# Patient Record
Sex: Female | Born: 1945 | Race: White | Hispanic: No | Smoking: Current every day smoker
Health system: Southern US, Community
[De-identification: ages and names within clinical notes are randomized; demographics above are authoritative.]

## PROBLEM LIST (undated history)

## (undated) DIAGNOSIS — M6530 Trigger finger, unspecified finger: Secondary | ICD-10-CM

## (undated) DIAGNOSIS — Z8711 Personal history of peptic ulcer disease: Secondary | ICD-10-CM

## (undated) DIAGNOSIS — E039 Hypothyroidism, unspecified: Secondary | ICD-10-CM

## (undated) DIAGNOSIS — R002 Palpitations: Secondary | ICD-10-CM

## (undated) DIAGNOSIS — Z720 Tobacco use: Secondary | ICD-10-CM

## (undated) DIAGNOSIS — I471 Supraventricular tachycardia, unspecified: Secondary | ICD-10-CM

## (undated) DIAGNOSIS — M1990 Unspecified osteoarthritis, unspecified site: Secondary | ICD-10-CM

## (undated) DIAGNOSIS — E785 Hyperlipidemia, unspecified: Secondary | ICD-10-CM

## (undated) DIAGNOSIS — M5030 Other cervical disc degeneration, unspecified cervical region: Secondary | ICD-10-CM

## (undated) HISTORY — PX: KNEE SURGERY: SHX244

## (undated) HISTORY — DX: Trigger finger, unspecified finger: M65.30

## (undated) HISTORY — DX: Supraventricular tachycardia: I47.1

## (undated) HISTORY — DX: Supraventricular tachycardia, unspecified: I47.10

## (undated) HISTORY — DX: Palpitations: R00.2

## (undated) HISTORY — DX: Tobacco use: Z72.0

## (undated) HISTORY — DX: Hyperlipidemia, unspecified: E78.5

## (undated) HISTORY — DX: Unspecified osteoarthritis, unspecified site: M19.90

## (undated) HISTORY — DX: Personal history of peptic ulcer disease: Z87.11

## (undated) HISTORY — DX: Other cervical disc degeneration, unspecified cervical region: M50.30

## (undated) HISTORY — DX: Hypothyroidism, unspecified: E03.9

---

## 2006-01-07 ENCOUNTER — Ambulatory Visit: Payer: Self-pay | Admitting: Family Medicine

## 2007-02-11 ENCOUNTER — Ambulatory Visit: Payer: Self-pay | Admitting: Family Medicine

## 2008-04-14 ENCOUNTER — Ambulatory Visit: Payer: Self-pay | Admitting: Family Medicine

## 2009-06-23 ENCOUNTER — Ambulatory Visit: Payer: Self-pay | Admitting: Internal Medicine

## 2010-07-03 ENCOUNTER — Ambulatory Visit: Payer: Self-pay | Admitting: Internal Medicine

## 2010-12-28 ENCOUNTER — Emergency Department: Payer: Self-pay | Admitting: Internal Medicine

## 2011-07-12 ENCOUNTER — Encounter: Payer: Self-pay | Admitting: *Deleted

## 2011-07-15 ENCOUNTER — Ambulatory Visit: Payer: Self-pay | Admitting: Internal Medicine

## 2011-07-18 ENCOUNTER — Encounter (INDEPENDENT_AMBULATORY_CARE_PROVIDER_SITE_OTHER): Payer: Medicare Other | Admitting: *Deleted

## 2011-07-18 DIAGNOSIS — R002 Palpitations: Secondary | ICD-10-CM

## 2011-07-18 DIAGNOSIS — I498 Other specified cardiac arrhythmias: Secondary | ICD-10-CM

## 2011-07-23 ENCOUNTER — Encounter: Payer: Self-pay | Admitting: *Deleted

## 2011-07-24 ENCOUNTER — Ambulatory Visit (INDEPENDENT_AMBULATORY_CARE_PROVIDER_SITE_OTHER): Payer: Medicare Other | Admitting: Cardiovascular Disease

## 2011-07-24 ENCOUNTER — Encounter: Payer: Self-pay | Admitting: Cardiovascular Disease

## 2011-07-24 VITALS — BP 122/80 | HR 65 | Ht 61.0 in | Wt 159.0 lb

## 2011-07-24 DIAGNOSIS — E039 Hypothyroidism, unspecified: Secondary | ICD-10-CM

## 2011-07-24 DIAGNOSIS — I471 Supraventricular tachycardia: Secondary | ICD-10-CM

## 2011-07-24 DIAGNOSIS — R002 Palpitations: Secondary | ICD-10-CM

## 2011-07-24 DIAGNOSIS — F17200 Nicotine dependence, unspecified, uncomplicated: Secondary | ICD-10-CM

## 2011-07-24 DIAGNOSIS — I498 Other specified cardiac arrhythmias: Secondary | ICD-10-CM

## 2011-07-24 MED ORDER — PROPRANOLOL HCL 20 MG PO TABS: 20.0000 mg | ORAL_TABLET | Freq: Three times a day (TID) | ORAL | Status: DC | PRN

## 2011-07-24 NOTE — Assessment & Plan Note (Signed)
She seems to be feeling better on the lower dose of thyroid medication. She will followup with Dr. Arlana Pouch.

## 2011-07-24 NOTE — Patient Instructions (Addendum)
You are doing well. Take propranolol as needed for palpitations. Stop smoking!!  Please call us if you have new issues that need to be addressed

## 2011-07-24 NOTE — Assessment & Plan Note (Signed)
Long smoking history who continues to smoke one pack per day. We have encouraged her to continue to work on weaning her cigarettes and smoking cessation. She will continue to work on this and does not want any assistance with chantix.

## 2011-07-24 NOTE — Assessment & Plan Note (Signed)
Frequent ectopy, predominantly APCs noted. Rare short runs of SVT. She is currently asymptomatic after her thyroid medicine was decreased and she cut back on her caffeine. Stress may also have been playing a role in her symptoms. We have given her a prescription for propranolol 20 mg p.r.n. For palpitations. She could start with a half pill if needed.

## 2011-07-24 NOTE — Progress Notes (Signed)
   Patient ID: Kayler Buckholtz, female    DOB: Apr 20, 1946, 66 y.o.   MRN: [id number]  HPI Comments: Ms. Ballester is a very pleasant 66 year old woman, patient of Dr. Arlana Pouch, who reports having recent problems with palpitations. She has hypothyroidism and smokes one pack per day for many years. No significant family history of coronary artery disease.   She does report that she had been drinking significant caffeine. Lab work recently showed TSH 0.388 and her thyroid medication was decreased.  Over the past several days, she has had significant improvement in her palpitations. Prior to that, she was having severe symptoms of tachycardia with palpitations. She denies any chest pain, lower extremity edema. She did have some shortness of breath when her palpitations occurred.   EKG today shows normal sinus rhythm with rate 65 beats per minute with nonspecific T-wave abnormality in V1, V2  Holter monitor was ordered by Dr. Arlana Pouch that showed frequent episodes of APCs and short runs of SVT   Outpatient Encounter Prescriptions as of 07/24/2011  Medication Sig Dispense Refill  . levothyroxine (SYNTHROID, LEVOTHROID) 75 MCG tablet Take one tablet on Mon. Tues. Wed. Thurs & Sun., 1/2 tablet Friday & Sat.      Marland Kitchen PARoxetine (PAXIL) 20 MG tablet Take 20 mg by mouth daily.      . ranitidine (ZANTAC) 150 MG capsule Take 150 mg by mouth 2 (two) times daily.      . propranolol (INDERAL) 20 MG tablet Take 1 tablet (20 mg total) by mouth 3 (three) times daily as needed.  90 tablet  4    Review of Systems  Constitutional: Negative.   HENT: Negative.   Eyes: Negative.   Respiratory: Negative.   Cardiovascular: Positive for palpitations.  Gastrointestinal: Negative.   Musculoskeletal: Negative.   Skin: Negative.   Neurological: Negative.   Hematological: Negative.   Psychiatric/Behavioral: Negative.   All other systems reviewed and are negative.    BP 122/80  Pulse 65  Ht 5\' 1"  (1.549 m)  Wt 159 lb (72.122  kg)  BMI 30.04 kg/m2  Physical Exam  Nursing note and vitals reviewed. Constitutional: She is oriented to person, place, and time. She appears well-developed and well-nourished.  HENT:  Head: Normocephalic.  Nose: Nose normal.  Mouth/Throat: Oropharynx is clear and moist.  Eyes: Conjunctivae are normal. Pupils are equal, round, and reactive to light.  Neck: Normal range of motion. Neck supple. No JVD present.  Cardiovascular: Normal rate, regular rhythm, S1 normal, S2 normal, normal heart sounds and intact distal pulses.  Exam reveals no gallop and no friction rub.   No murmur heard. Pulmonary/Chest: Effort normal and breath sounds normal. No respiratory distress. She has no wheezes. She has no rales. She exhibits no tenderness.  Abdominal: Soft. Bowel sounds are normal. She exhibits no distension. There is no tenderness.  Musculoskeletal: Normal range of motion. She exhibits no edema and no tenderness.  Lymphadenopathy:    She has no cervical adenopathy.  Neurological: She is alert and oriented to person, place, and time. Coordination normal.  Skin: Skin is warm and dry. No rash noted. No erythema.  Psychiatric: She has a normal mood and affect. Her behavior is normal. Judgment and thought content normal.         Assessment and Plan

## 2011-08-27 ENCOUNTER — Encounter: Payer: Self-pay | Admitting: Cardiovascular Disease

## 2012-07-28 ENCOUNTER — Ambulatory Visit: Payer: Self-pay | Admitting: Internal Medicine

## 2013-08-12 ENCOUNTER — Ambulatory Visit: Payer: Self-pay | Admitting: Internal Medicine

## 2014-06-15 ENCOUNTER — Ambulatory Visit: Payer: Self-pay

## 2014-10-12 ENCOUNTER — Other Ambulatory Visit: Payer: Self-pay | Admitting: Internal Medicine

## 2014-10-12 DIAGNOSIS — Z1231 Encounter for screening mammogram for malignant neoplasm of breast: Secondary | ICD-10-CM

## 2014-10-26 ENCOUNTER — Ambulatory Visit: Payer: PRIVATE HEALTH INSURANCE

## 2014-10-28 ENCOUNTER — Other Ambulatory Visit: Payer: Self-pay | Admitting: Internal Medicine

## 2014-10-28 ENCOUNTER — Ambulatory Visit
Admission: RE | Admit: 2014-10-28 | Discharge: 2014-10-28 | Disposition: A | Discharge status: Home / Self Care | Payer: Medicare Other | Source: Ambulatory Visit | Attending: Internal Medicine | Admitting: Internal Medicine

## 2014-10-28 DIAGNOSIS — Z1231 Encounter for screening mammogram for malignant neoplasm of breast: Secondary | ICD-10-CM

## 2015-09-18 ENCOUNTER — Other Ambulatory Visit: Payer: Self-pay | Admitting: Internal Medicine

## 2015-09-18 DIAGNOSIS — Z1231 Encounter for screening mammogram for malignant neoplasm of breast: Secondary | ICD-10-CM

## 2015-10-31 ENCOUNTER — Ambulatory Visit: Payer: PRIVATE HEALTH INSURANCE

## 2015-11-14 ENCOUNTER — Ambulatory Visit
Admission: RE | Admit: 2015-11-14 | Discharge: 2015-11-14 | Disposition: A | Discharge status: Home / Self Care | Payer: Medicare Other | Source: Ambulatory Visit | Attending: Internal Medicine | Admitting: Internal Medicine

## 2015-11-14 DIAGNOSIS — Z1231 Encounter for screening mammogram for malignant neoplasm of breast: Secondary | ICD-10-CM | POA: Diagnosis present

## 2016-06-19 ENCOUNTER — Ambulatory Visit (INDEPENDENT_AMBULATORY_CARE_PROVIDER_SITE_OTHER): Payer: Self-pay | Admitting: Orthopaedic Surgery

## 2016-06-22 ENCOUNTER — Ambulatory Visit (INDEPENDENT_AMBULATORY_CARE_PROVIDER_SITE_OTHER): Payer: Medicare Other

## 2016-06-22 ENCOUNTER — Ambulatory Visit (INDEPENDENT_AMBULATORY_CARE_PROVIDER_SITE_OTHER): Payer: Medicare Other | Admitting: Orthopaedic Surgery

## 2016-06-22 ENCOUNTER — Encounter (INDEPENDENT_AMBULATORY_CARE_PROVIDER_SITE_OTHER): Payer: Self-pay | Admitting: Orthopaedic Surgery

## 2016-06-22 DIAGNOSIS — M25562 Pain in left knee: Secondary | ICD-10-CM

## 2016-06-22 MED ORDER — LIDOCAINE HCL 1 % IJ SOLN
3.0000 mL | INTRAMUSCULAR | Status: AC | PRN
  Administered 2016-06-22: 3 mL

## 2016-06-22 MED ORDER — METHYLPREDNISOLONE ACETATE 40 MG/ML IJ SUSP
40.0000 mg | INTRAMUSCULAR | Status: AC | PRN
  Administered 2016-06-22: 40 mg via INTRA_ARTICULAR

## 2016-06-22 NOTE — Progress Notes (Signed)
Office Visit Note   Patient: Donna Frazier           Date of Birth: 1946-05-24           MRN: [id number] Visit Date: 06/22/2016              Requested by: Jaclyn Shaggy, MD [address]   [place], [place] [address] PCP: Jaclyn Shaggy, MD   Assessment & Plan: Visit Diagnoses:  1. Acute pain of left knee     Plan: She tolerated the injection well and her left knee. She may be a good candidate for hyaluronic acid in the future. I gave her handout about this.  Follow-Up Instructions: Return if symptoms worsen or fail to improve.   Orders:  Orders Placed This Encounter  Procedures  . Large Joint Injection/Arthrocentesis  . XR Knee 1-2 Views Left   No orders of the defined types were placed in this encounter.     Procedures: Large Joint Inj Date/Time: 06/22/2016 11:33 AM Performed by: Kathryne Hitch Authorized by: Kathryne Hitch   Location:  Knee Site:  L knee Ultrasound Guidance: No   Fluoroscopic Guidance: No   Arthrogram: No   Medications:  3 mL lidocaine 1 %; 40 mg methylPREDNISolone acetate 40 MG/ML     Clinical Data: No additional findings.   Subjective: Chief Complaint  Patient presents with  . Left Knee - Pain  She reports her left knee isn't bothering her more acutely. As she wants the medial joint line as source for pain. This is the knee we performed arthroscopic intervention on years ago. It does not really locking catching on her but does wake her up at night on occasion. Hurts mainly with activities.  HPI  Review of Systems She is alert and oriented 3 and in no acute distress. She denies any chest pain, shortness of breath, headache, fever, chills, nausea, vomiting.  Objective: Vital Signs: There were no vitals taken for this visit.  Physical Exam She endplates without a limp Ortho Exam Examination of her left knee shows medial joint line tenderness. There is a small loose cartilage area and the soft tissue along the  medial joint line but the range of motion is full. His no effusion. There is no ligamentous instability. There is patellofemoral crepitation. Specialty Comments:  No specialty comments available.  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 This involves mainly the medial compartment and the patellofemoral joint. There is joint space narrowing and per trigger osteophytes around the medial compartment and the patellofemoral joint. His no acute findings and no effusion.    PMFS History: Patient Active Problem List   Diagnosis Date Noted  . Palpitations 07/24/2011  . SVT (supraventricular tachycardia) (HCC) 07/24/2011  . Smoking 07/24/2011  . Hypothyroidism 07/24/2011   Past Medical History:  Diagnosis Date  . Irregular heart beat   . Migraine   . Palpitations   . Thyroid disease     Family History  Problem Relation Age of Onset  . Heart attack Father 63    Past Surgical History:  Procedure Laterality Date  . KNEE SURGERY     Social History   Occupational History  . Not on file.   Social History Main Topics  . Smoking status: Current Every Day Smoker    Packs/day: 1.00    Years: 45.00    Types: Cigarettes  .  Smokeless tobacco: Current User    Types: Snuff  . Alcohol use 0.5 oz/week    1 drink(s) per week  . Drug use: No  . Sexual activity: Not on file

## 2016-09-10 ENCOUNTER — Ambulatory Visit (INDEPENDENT_AMBULATORY_CARE_PROVIDER_SITE_OTHER): Payer: Medicare Other

## 2016-09-10 ENCOUNTER — Ambulatory Visit (INDEPENDENT_AMBULATORY_CARE_PROVIDER_SITE_OTHER): Payer: Medicare Other | Admitting: Physician Assistant

## 2016-09-10 ENCOUNTER — Other Ambulatory Visit (INDEPENDENT_AMBULATORY_CARE_PROVIDER_SITE_OTHER): Payer: Self-pay

## 2016-09-10 DIAGNOSIS — R1030 Lower abdominal pain, unspecified: Secondary | ICD-10-CM | POA: Diagnosis not present

## 2016-09-10 DIAGNOSIS — M25511 Pain in right shoulder: Secondary | ICD-10-CM

## 2016-09-10 DIAGNOSIS — M542 Cervicalgia: Secondary | ICD-10-CM

## 2016-09-10 DIAGNOSIS — M25551 Pain in right hip: Secondary | ICD-10-CM

## 2016-09-10 DIAGNOSIS — R1031 Right lower quadrant pain: Secondary | ICD-10-CM

## 2016-09-10 DIAGNOSIS — M25552 Pain in left hip: Secondary | ICD-10-CM

## 2016-09-10 DIAGNOSIS — R1032 Left lower quadrant pain: Secondary | ICD-10-CM

## 2016-09-10 MED ORDER — METHYLPREDNISOLONE 4 MG PO TABS: ORAL_TABLET | ORAL | 0 refills | Status: DC

## 2016-09-10 MED ORDER — METHOCARBAMOL 500 MG PO TABS: 500.0000 mg | ORAL_TABLET | Freq: Three times a day (TID) | ORAL | 1 refills | Status: DC

## 2016-09-10 NOTE — Progress Notes (Signed)
Office Visit Note   Patient: Donna Frazier           Date of Birth: Apr 03, 1946           MRN: [id number] Visit Date: 09/10/2016              Requested by: Jaclyn Shaggy, MD [address]   [place], [place] [address] PCP: Jaclyn Shaggy, MD   Assessment & Plan: Visit Diagnoses:  1. Acute pain of right shoulder   2. Neck pain   3. Bilateral groin pain     Plan: We'll send her to physical therapy for her neck right shoulder and right hip. There to work on range of motion strengthening, modalities, home exercise program. Also placed her on a Medrol Dosepak and a muscle relaxant. Moist deep to the neck. Follow with [place] in a month check progress lack of.  Follow-Up Instructions: Return in about 4 weeks (around 10/08/2016).   Orders:  Orders Placed This Encounter  Procedures  . XR Shoulder Right  . XR Cervical Spine 2 or 3 views  . XR HIPS BILAT W OR W/O PELVIS 2V   Meds ordered this encounter  Medications  . methylPREDNISolone (MEDROL) 4 MG tablet    Sig: Take as directed    Dispense:  21 tablet    Refill:  0  . methocarbamol (ROBAXIN) 500 MG tablet    Sig: Take 1 tablet (500 mg total) by mouth 3 (three) times daily.    Dispense:  40 tablet    Refill:  1      Procedures: No procedures performed   Clinical Data: No additional findings.   Subjective: Neck pain Right shoulder pain Bilateral hip pain  HPI  Donna Frazier Was in today with upper shoulder blade and neck pain for 1 month. No known injury. She denies any radicular symptoms down either arm. She doesn't burning pain from her neck into her right shoulder. Pain is not, full of constant. Painful for her raise the right arm above her head. She is also having bilateral hip pain she points to the groin area and  questions if his arthritis. She does not have problems putting on shoes or socks. She does have some discomfort with lying on her hipst. She's having no radicular symptoms down either leg.   Review of  Systems Denies fevers chills, shortness of breath, nausea, vomiting or acute injury. Please see history of present illness otherwise  Objective: Vital Signs: There were no vitals taken for this visit.  Physical Exam  Constitutional: She is oriented to person, place, and time. She appears well-developed and well-nourished. No distress.  Pulmonary/Chest: Effort normal.  Neurological: She is alert and oriented to person, place, and time.  Psychiatric: She has a normal mood and affect.    Ortho Exam Cervical spine she has limited extension of the cervical spine. Good flexion. Limited rotation. Spurling's test is equivocal due to decreased range of motion. Tenderness right trapezius region and right medial border of the scapula. Radial pulses are 2+ and equal symmetric. Deep tendon reflexes are 2+ at the biceps triceps and brachial radialis and equal and symmetric throughout. Station grossly intact bilateral hands to light touch. Full motor bilateral hands Bilateral shoulders she has 5 out of 5 strength with external and internal rotation against resistance. Empty can test negative bilaterally. Positive impingement testing on the right. Tenderness over the greater tuberosity of the right shoulder. Bilateral hip she has excellent range of motion of both  hips internal rotation of the right hip causes pain groin area. She has tenderness over the right trochanteric region of the hip no tenderness left hip over the greater trochanteric region. Specialty Comments:  No specialty comments available.  Imaging: Xr Hips Bilat W Or W/o Pelvis 2v  Result Date: 09/10/2016 AP pelvis and bilateral lateral hips: Hips are well located. Hip joints well maintained. No acute fractures. Sclerotic changes over the greater trochanteric region of both hips.  Xr Cervical Spine 2 Or 3 Views  Result Date: 09/10/2016 Medical spine AP lateral views: No acute fractures. She has some multiple levels of the endplate spurring  with bridging. Disc space otherwise well-maintained. No spondylolisthesis. Loss of lordotic curvature.  Xr Shoulder Right  Result Date: 09/10/2016 3 views right shoulder: Right shoulders well located. No acute fracture. Subacromial space is slightly diminished on the Y view. Otherwise the glenohumeral joint is well maintained. The humeral head located and is well-rounded without signs of arthritic changes or AVN.    PMFS History: Patient Active Problem List   Diagnosis Date Noted  . Palpitations 07/24/2011  . SVT (supraventricular tachycardia) (HCC) 07/24/2011  . Smoking 07/24/2011  . Hypothyroidism 07/24/2011   Past Medical History:  Diagnosis Date  . Irregular heart beat   . Migraine   . Palpitations   . Thyroid disease     Family History  Problem Relation Age of Onset  . Heart attack Father 57    Past Surgical History:  Procedure Laterality Date  . KNEE SURGERY     Social History   Occupational History  . Not on file.   Social History Main Topics  . Smoking status: Current Every Day Smoker    Packs/day: 1.00    Years: 45.00    Types: Cigarettes  . Smokeless tobacco: Current User    Types: Snuff  . Alcohol use 0.5 oz/week    1 drink(s) per week  . Drug use: No  . Sexual activity: Not on file

## 2016-10-01 ENCOUNTER — Other Ambulatory Visit: Payer: Self-pay | Admitting: Internal Medicine

## 2016-10-01 DIAGNOSIS — Z1231 Encounter for screening mammogram for malignant neoplasm of breast: Secondary | ICD-10-CM

## 2016-10-02 ENCOUNTER — Ambulatory Visit: Payer: Medicare Other

## 2016-10-08 ENCOUNTER — Encounter: Payer: PRIVATE HEALTH INSURANCE | Admitting: Physical Therapy

## 2016-10-14 ENCOUNTER — Encounter: Payer: PRIVATE HEALTH INSURANCE | Admitting: Physical Therapy

## 2016-10-16 ENCOUNTER — Encounter: Payer: PRIVATE HEALTH INSURANCE | Admitting: Physical Therapy

## 2016-11-29 ENCOUNTER — Ambulatory Visit
Admission: RE | Admit: 2016-11-29 | Discharge: 2016-11-29 | Disposition: A | Discharge status: Home / Self Care | Payer: Medicare Other | Source: Ambulatory Visit | Attending: Internal Medicine | Admitting: Internal Medicine

## 2016-11-29 DIAGNOSIS — Z1231 Encounter for screening mammogram for malignant neoplasm of breast: Secondary | ICD-10-CM | POA: Diagnosis present

## 2017-04-10 ENCOUNTER — Ambulatory Visit (INDEPENDENT_AMBULATORY_CARE_PROVIDER_SITE_OTHER): Payer: Medicare Other | Admitting: Orthopaedic Surgery

## 2017-04-10 ENCOUNTER — Encounter (INDEPENDENT_AMBULATORY_CARE_PROVIDER_SITE_OTHER): Payer: Self-pay | Admitting: Orthopaedic Surgery

## 2017-04-10 DIAGNOSIS — R2232 Localized swelling, mass and lump, left upper limb: Secondary | ICD-10-CM | POA: Diagnosis not present

## 2017-04-10 NOTE — Progress Notes (Signed)
Office Visit Note   Patient: Donna Frazier           Date of Birth: 27-Mar-1946           MRN: [id number] Visit Date: 04/10/2017              Requested by: Jaclyn Shaggyate, Denny C, MD [id number]   [place], [place] [id number] PCP: Jaclyn Shaggyate, Denny C, MD   Assessment & Plan: Visit Diagnoses:  1. Finger mass, left     Plan: I used an 18-gauge needle and tried to aspirate any fluid from this mass in her index finger and was then successful.  I will have her watch this for the next 3 weeks and see her back for repeat evaluation to determine whether or not surgery is warranted.  All questions and concerns were answered and addressed.  The patient comes in today with a painful left  Follow-Up Instructions: Return in about 3 weeks (around 05/01/2017).   Orders:  No orders of the defined types were placed in this encounter.  No orders of the defined types were placed in this encounter.     Procedures: No procedures performed   Clinical Data: No additional findings.   Subjective: Chief Complaint  Patient presents with  . Left Hand - Cyst  Hand mass and she points her index finger near the MCP joint more to the radial and volar surface.  She says she has noticed it for about a month or so now has become painful to her.  She denies any numbness and tingling in that finger from that but says she does have carpal tunnel history.  She denies any injury to this area that she is aware of.  He says it is painful mainly to the touch and with gripping things and activities of daily living as well because her to have pain on that area.  She says she showed it to her primary care physician he was not sure what was she seeing us for further evaluation and treatment of this left finger mass that is painful.   HPI  Review of Systems She currently denies any headache, chest pain, shortness of breath, fever, chills, nausea, vomiting.  Objective: Vital Signs: There were no vitals taken for this  visit.  Physical Exam She is alert and oriented x3 and in no acute distress Ortho Exam Examination of her left index finger does show a palpable mass that is hypermobile and soft tissue just radial and volar to the MCP joint.  She is able to move the joints of the index finger easily.  There is no draining area from this.  She is neurovascular intact of her index finger as well. Specialty Comments:  No specialty comments available.  Imaging: No results found.   PMFS History: Patient Active Problem List   Diagnosis Date Noted  . Finger mass, left 04/10/2017  . Palpitations 07/24/2011  . SVT (supraventricular tachycardia) (HCC) 07/24/2011  . Smoking 07/24/2011  . Hypothyroidism 07/24/2011   Past Medical History:  Diagnosis Date  . Irregular heart beat   . Migraine   . Palpitations   . Thyroid disease     Family History  Problem Relation Age of Onset  . Heart attack Father [id number]  . Breast cancer Neg Hx     Past Surgical History:  Procedure Laterality Date  . KNEE SURGERY     Social History   Occupational History  . Not on file  Tobacco Use  .  Smoking status: Current Every Day Smoker    Packs/day: 1.00    Years: 45.00    Pack years: 45.00    Types: Cigarettes  . Smokeless tobacco: Current User    Types: Snuff  Substance and Sexual Activity  . Alcohol use: Yes    Alcohol/week: 0.5 oz    Types: 1 drink(s) per week  . Drug use: No  . Sexual activity: Not on file

## 2017-05-01 ENCOUNTER — Ambulatory Visit (INDEPENDENT_AMBULATORY_CARE_PROVIDER_SITE_OTHER): Payer: Medicare Other | Admitting: Orthopaedic Surgery

## 2017-07-16 ENCOUNTER — Ambulatory Visit (INDEPENDENT_AMBULATORY_CARE_PROVIDER_SITE_OTHER): Payer: Medicare Other | Admitting: Orthopaedic Surgery

## 2017-10-17 ENCOUNTER — Encounter: Payer: Self-pay | Admitting: Emergency Medicine

## 2017-10-17 ENCOUNTER — Emergency Department
Admission: EM | Admit: 2017-10-17 | Discharge: 2017-10-17 | Disposition: A | Discharge status: Home / Self Care | Payer: Medicare Other | Attending: Emergency Medicine | Admitting: Emergency Medicine

## 2017-10-17 ENCOUNTER — Emergency Department: Payer: Medicare Other

## 2017-10-17 DIAGNOSIS — E039 Hypothyroidism, unspecified: Secondary | ICD-10-CM | POA: Diagnosis not present

## 2017-10-17 DIAGNOSIS — F17210 Nicotine dependence, cigarettes, uncomplicated: Secondary | ICD-10-CM | POA: Diagnosis not present

## 2017-10-17 DIAGNOSIS — S46912A Strain of unspecified muscle, fascia and tendon at shoulder and upper arm level, left arm, initial encounter: Secondary | ICD-10-CM | POA: Diagnosis not present

## 2017-10-17 DIAGNOSIS — W06XXXA Fall from bed, initial encounter: Secondary | ICD-10-CM | POA: Diagnosis not present

## 2017-10-17 DIAGNOSIS — Z79899 Other long term (current) drug therapy: Secondary | ICD-10-CM | POA: Diagnosis not present

## 2017-10-17 DIAGNOSIS — M62838 Other muscle spasm: Secondary | ICD-10-CM

## 2017-10-17 DIAGNOSIS — Y999 Unspecified external cause status: Secondary | ICD-10-CM | POA: Diagnosis not present

## 2017-10-17 DIAGNOSIS — Y92013 Bedroom of single-family (private) house as the place of occurrence of the external cause: Secondary | ICD-10-CM | POA: Insufficient documentation

## 2017-10-17 DIAGNOSIS — Y9384 Activity, sleeping: Secondary | ICD-10-CM | POA: Insufficient documentation

## 2017-10-17 DIAGNOSIS — S4992XA Unspecified injury of left shoulder and upper arm, initial encounter: Secondary | ICD-10-CM | POA: Diagnosis present

## 2017-10-17 DIAGNOSIS — W19XXXA Unspecified fall, initial encounter: Secondary | ICD-10-CM

## 2017-10-17 MED ORDER — LIDOCAINE 5 % EX PTCH
1.0000 | MEDICATED_PATCH | CUTANEOUS | Status: DC
  Administered 2017-10-17: 1 via TRANSDERMAL
  Filled 2017-10-17: qty 1

## 2017-10-17 MED ORDER — HYDROCODONE-ACETAMINOPHEN 5-325 MG PO TABS: 1.0000 | ORAL_TABLET | Freq: Four times a day (QID) | ORAL | 0 refills | Status: DC | PRN

## 2017-10-17 MED ORDER — LIDOCAINE 5 % EX PTCH: 1.0000 | MEDICATED_PATCH | Freq: Two times a day (BID) | CUTANEOUS | 0 refills | Status: DC

## 2017-10-17 MED ORDER — OXYCODONE-ACETAMINOPHEN 5-325 MG PO TABS
1.0000 | ORAL_TABLET | Freq: Once | ORAL | Status: AC
  Administered 2017-10-17: 1 via ORAL
  Filled 2017-10-17: qty 1

## 2017-10-17 NOTE — ED Notes (Signed)
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
## 2017-10-17 NOTE — Discharge Instructions (Addendum)
Fortunately, as we discussed, you have no evidence of acute fracture or dislocation on your x-rays.  You certainly strange her shoulder and it does appear you are having some muscle spasms as a result of your injury.  Please use over-the-counter Tylenol and/or ibuprofen (unless your doctor has told you to not take ibuprofen in the past) for pain control.  Take Norco as prescribed for severe pain. Do not drink alcohol, drive or participate in any other potentially dangerous activities while taking this medication as it may make you sleepy. Do not take this medication with any other sedating medications, either prescription or over-the-counter. If you were prescribed Percocet or Vicodin, do not take these with acetaminophen (Tylenol) as it is already contained within these medications.   This medication is an opiate (or narcotic) pain medication and can be habit forming.  Use it as little as possible to achieve adequate pain control.  Do not use or use it with extreme caution if you have a history of opiate abuse or dependence.  If you are on a pain contract with your primary care doctor or a pain specialist, be sure to let them know you were prescribed this medication today from the [facility] Emergency Department.  This medication is intended for your use only - do not give any to anyone else and keep it in a secure place where nobody else, especially children, have access to it.  It will also cause or worsen constipation, so you may want to consider taking an over-the-counter stool softener while you are taking this medication.  Use your sling as needed for pain relief but try to use your arm as much as you can and read through the included information about hand and arm exercises so that your arm stays mobile.  Keeping it immobile to long may lead to increased stiffness and decreased range of motion.  We recommend you use cold packs intermittently for the next several days.  Once the acute phase of  the injury has passed you can consider using heating pads which may also help with the muscles, but ice packs are more likely to be helpful in the short-term.    Return to the emergency department if you develop new or worsening symptoms that concern you.

## 2017-10-20 ENCOUNTER — Encounter (INDEPENDENT_AMBULATORY_CARE_PROVIDER_SITE_OTHER): Payer: Self-pay | Admitting: Physician Assistant

## 2017-10-20 ENCOUNTER — Ambulatory Visit (INDEPENDENT_AMBULATORY_CARE_PROVIDER_SITE_OTHER): Payer: Medicare Other | Admitting: Physician Assistant

## 2017-10-20 DIAGNOSIS — M25512 Pain in left shoulder: Secondary | ICD-10-CM

## 2017-10-20 MED ORDER — LIDOCAINE HCL 1 % IJ SOLN
3.0000 mL | INTRAMUSCULAR | Status: AC | PRN
  Administered 2017-10-20: 3 mL

## 2017-10-20 MED ORDER — METHYLPREDNISOLONE ACETATE 40 MG/ML IJ SUSP
40.0000 mg | INTRAMUSCULAR | Status: AC | PRN
  Administered 2017-10-20: 40 mg via INTRA_ARTICULAR
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
## 2017-10-22 ENCOUNTER — Ambulatory Visit (INDEPENDENT_AMBULATORY_CARE_PROVIDER_SITE_OTHER): Payer: Medicare Other | Admitting: Orthopaedic Surgery

## 2017-11-03 ENCOUNTER — Encounter (INDEPENDENT_AMBULATORY_CARE_PROVIDER_SITE_OTHER): Payer: Self-pay | Admitting: Physician Assistant

## 2017-11-03 ENCOUNTER — Ambulatory Visit (INDEPENDENT_AMBULATORY_CARE_PROVIDER_SITE_OTHER): Payer: Medicare Other | Admitting: Physician Assistant

## 2017-11-03 DIAGNOSIS — M25512 Pain in left shoulder: Secondary | ICD-10-CM | POA: Diagnosis not present

## 2017-11-03 NOTE — Progress Notes (Signed)
HPI: Donna Frazier returns today for follow-up of her left shoulder.  Again she had a fall on 10/17/2017 injuring the shoulder.  She was given cortisone injection on 10/20/2017.  She states that shoulder feels 90% better.  She is been doing home exercise program is shown.  She has some 1 out of 10 discomfort in the left shoulder only with certain movements and she demonstrates extreme external rotation.  She denies any radicular symptoms down the left arm.  Physical exam: Bilateral shoulders she has 5 out of 5 strength with external and internal rotation against resistance.  Negative impingement testing on the left.  Empty can test is negative bilaterally.  Impression: Left shoulder pain improved.  Plan: Offered her formal therapy she defers.  Therefore she is given Thera-Band she will continue to do the exercises as shown at last visit and I had therapy and strengthening exercises as shown today.  She will follow-up on an as-needed basis.

## 2017-11-09 NOTE — Progress Notes (Deleted)
Cardiology Office Note  Date:  11/09/2017   ID:  Donna Frazier, DOB November 24, 1945, MRN [id number]  PCP:  Jaclyn Shaggy, MD   No chief complaint on file.   HPI:  Donna Frazier is a very pleasant 72 year old woman,  palpitations.  hypothyroidism  smokes one pack per day for many years.       EKG today shows normal sinus rhythm with rate 65 beats per minute with nonspecific T-wave abnormality in V1, V2  Holter monitor was ordered by Dr. Arlana Pouch that showed frequent episodes of APCs and short runs of SVT    PMH:   has a past medical history of Irregular heart beat, Migraine, Palpitations, and Thyroid disease.  PSH:    Past Surgical History:  Procedure Laterality Date  . KNEE SURGERY      Current Outpatient Medications  Medication Sig Dispense Refill  . HYDROcodone-acetaminophen (NORCO/VICODIN) 5-325 MG tablet Take 1-2 tablets by mouth every 6 (six) hours as needed for moderate pain. 15 tablet 0  . levothyroxine (SYNTHROID, LEVOTHROID) 75 MCG tablet Take one tablet on Mon. Tues. Wed. Thurs & Sun., 1/2 tablet Friday & Sat.    . lidocaine (LIDODERM) 5 % Place 1 patch onto the skin every 12 (twelve) hours. Remove & Discard patch within 12 hours or as directed by MD.  Wynelle Fanny the patch off for 12 hours before applying a new one. 10 patch 0  . methocarbamol (ROBAXIN) 500 MG tablet Take 1 tablet (500 mg total) by mouth 3 (three) times daily. (Patient not taking: Reported on 10/20/2017) 40 tablet 1  . methylPREDNISolone (MEDROL) 4 MG tablet Take as directed (Patient not taking: Reported on 10/20/2017) 21 tablet 0  . PARoxetine (PAXIL) 20 MG tablet Take 20 mg by mouth daily.    . propranolol (INDERAL) 20 MG tablet Take 1 tablet (20 mg total) by mouth 3 (three) times daily as needed. 90 tablet 4  . ranitidine (ZANTAC) 150 MG capsule Take 150 mg by mouth 2 (two) times daily.     No current facility-administered medications for this visit.      Allergies:   Aspirin and Motrin [ibuprofen]   Social  History:  The patient  reports that she has been smoking cigarettes.  She has a 45.00 pack-year smoking history. Her smokeless tobacco use includes snuff. She reports that she drinks about 0.5 oz of alcohol per week. She reports that she does not use drugs.   Family History:   family history includes Heart attack (age of onset: 103) in her father.    Review of Systems: ROS   PHYSICAL EXAM: VS:  There were no vitals taken for this visit. , BMI There is no height or weight on file to calculate BMI. GEN: Well nourished, well developed, in no acute distress HEENT: normal Neck: no JVD, carotid bruits, or masses Cardiac: RRR; no murmurs, rubs, or gallops,no edema  Respiratory:  clear to auscultation bilaterally, normal work of breathing GI: soft, nontender, nondistended, + BS MS: no deformity or atrophy Skin: warm and dry, no rash Neuro:  Strength and sensation are intact Psych: euthymic mood, full affect    Recent Labs: No results found for requested labs within last 8760 hours.    Lipid Panel No results found for: CHOL, HDL, LDLCALC, TRIG    Wt Readings from Last 3 Encounters:  10/17/17 159 lb (72.1 kg)  07/24/11 159 lb (72.1 kg)       ASSESSMENT AND PLAN:  No diagnosis found.   Disposition:  F/U  6 months  No orders of the defined types were placed in this encounter.    Signed, Dossie Arbourim Tonae Livolsi, M.D., Ph.D. 11/09/2017  [facility], [place] [phone]

## 2017-11-10 ENCOUNTER — Ambulatory Visit: Payer: PRIVATE HEALTH INSURANCE | Admitting: Cardiovascular Disease

## 2018-01-06 ENCOUNTER — Other Ambulatory Visit: Payer: Self-pay | Admitting: Internal Medicine

## 2018-01-06 DIAGNOSIS — Z1231 Encounter for screening mammogram for malignant neoplasm of breast: Secondary | ICD-10-CM

## 2018-01-09 ENCOUNTER — Encounter: Payer: Self-pay | Admitting: *Deleted

## 2018-01-18 NOTE — Progress Notes (Signed)
Cardiology Office Note  Date:  01/19/2018   ID:  Donna Frazier, DOB May 27, 1946, MRN [id number]  PCP:  Donna Shaggyate, Denny C, MD   Chief Complaint  Patient presents with  . OTHER    LS 2013 no complaints today. Meds reviewed verbally with pt.    HPI:  Donna Frazier is a 72 year-old woman,  palpitations hypothyroidism  SVT on holter Hx of GI ulcers smokes one pack per day for many years Who presents for follow-up of her SVT and shortness of breath  "Here for check up" No arrhythmia, not on propranolol any longer Denies any chest pain on exertion Continues to spend much of her time taking care of her husband Chronic mild shortness of breath on exertion No regular exercise program  Continues to smoke  Lab work reviewed   total cholesterol 150 LDL 76 triglycerides 162  EKG personally reviewed by myself on todays visit Shows normal sinus rhythm rate 66 beats per minute no significant ST-T wave changes  Previous Holter monitor was ordered by Dr. Arlana Pouchate that showed frequent episodes of APCs and short runs of SVT  Family hx Father with MI Mother with ETOH    PMH:   has a past medical history of DDD (degenerative disc disease), cervical, peptic ulcer, Hyperlipidemia, Irregular heart beat, Migraine, Osteoarthritis, Palpitations, Thyroid disease, and Trigger finger.  PSH:    Past Surgical History:  Procedure Laterality Date  . KNEE SURGERY      Current Outpatient Medications  Medication Sig Dispense Refill  . atorvastatin (LIPITOR) 10 MG tablet Take 10 mg by mouth daily.    . Cyanocobalamin (B-12 PO) Take by mouth daily.    Marland Kitchen. levothyroxine (SYNTHROID, LEVOTHROID) 75 MCG tablet Take one tablet on Mon. Tues. Wed. Thurs & Sun., 1/2 tablet Friday & Sat.    . Multiple Vitamin (MULTIVITAMIN) capsule Take 1 capsule by mouth daily.    . ranitidine (ZANTAC) 150 MG capsule Take 150 mg by mouth 2 (two) times daily.     No current facility-administered medications for this visit.       Allergies:   Aspirin and Motrin [ibuprofen]   Social History:  The patient  reports that she has been smoking cigarettes. She has a 45.00 pack-year smoking history. Her smokeless tobacco use includes snuff. She reports that she drank about 1.0 standard drinks of alcohol per week. She reports that she does not use drugs.   Family History:   family history includes Heart attack (age of onset: 6169) in her father.    Review of Systems: Review of Systems  Constitutional: Negative.   Respiratory: Negative.   Cardiovascular: Negative.   Gastrointestinal: Negative.   Musculoskeletal: Negative.   Neurological: Negative.   Psychiatric/Behavioral: Negative.   All other systems reviewed and are negative.    PHYSICAL EXAM: VS:  BP 135/85 (BP Location: Right Arm, Patient Position: Sitting, Cuff Size: Normal)   Pulse 66   Ht 5' 1.5" (1.562 m)   Wt 166 lb 8 oz (75.5 kg)   BMI 30.95 kg/m  , BMI Body mass index is 30.95 kg/m. Constitutional:  oriented to person, place, and time. No distress.  HENT:  Head: Normocephalic and atraumatic.  Eyes:  no discharge. No scleral icterus.  Neck: Normal range of motion. Neck supple. No JVD present.  Cardiovascular: Normal rate, regular rhythm, normal heart sounds and intact distal pulses. Exam reveals no gallop and no friction rub. No edema No murmur heard. Pulmonary/Chest: Effort normal and breath sounds normal. No  stridor. No respiratory distress.  no wheezes.  no rales.  no tenderness.  Abdominal: Soft.  no distension.  no tenderness.  Musculoskeletal: Normal range of motion.  no  tenderness or deformity.  Neurological:  normal muscle tone. Coordination normal. No atrophy Skin: Skin is warm and dry. No rash noted. not diaphoretic.  Psychiatric:  normal mood and affect. behavior is normal. Thought content normal.    Recent Labs: No results found for requested labs within last 8760 hours.    Lipid Panel No results found for: CHOL, HDL,  LDLCALC, TRIG    Wt Readings from Last 3 Encounters:  01/19/18 166 lb 8 oz (75.5 kg)  10/17/17 159 lb (72.1 kg)  07/24/11 159 lb (72.1 kg)       ASSESSMENT AND PLAN:  SVT (supraventricular tachycardia) (HCC) - Plan: EKG 12-Lead Denies any tachycardia concerning for arrhythmia No further workup needed at this time  Smoking We have encouraged her to continue to work on weaning her cigarettes and smoking cessation. She will continue to work on this and does not want any assistance with chantix.   Hypothyroidism, unspecified type Manage her primary care  Preventive care Long discussion concerning Medicare coverage of routine chest screening CT given her history of smoking Also discussed CT coronary calcium scoring for risk stratification  she's not particularly interested At this time  Disposition:   F/U  As needed   Total encounter time more than 25 minutes  Greater than 50% was spent in counseling and coordination of care with the patient    Orders Placed This Encounter  Procedures  . EKG 12-Lead     Signed, Dossie Arbourim Bianney Rockwood, M.D., Ph.D. 01/19/2018  [facility] [place] [phone]

## 2018-01-19 ENCOUNTER — Ambulatory Visit (INDEPENDENT_AMBULATORY_CARE_PROVIDER_SITE_OTHER): Payer: Medicare Other | Admitting: Cardiovascular Disease

## 2018-01-19 ENCOUNTER — Encounter: Payer: Self-pay | Admitting: Cardiovascular Disease

## 2018-01-19 VITALS — BP 135/85 | HR 66 | Ht 61.5 in | Wt 166.5 lb

## 2018-01-19 DIAGNOSIS — F17200 Nicotine dependence, unspecified, uncomplicated: Secondary | ICD-10-CM

## 2018-01-19 DIAGNOSIS — R002 Palpitations: Secondary | ICD-10-CM | POA: Diagnosis not present

## 2018-01-19 DIAGNOSIS — E039 Hypothyroidism, unspecified: Secondary | ICD-10-CM

## 2018-01-19 DIAGNOSIS — I471 Supraventricular tachycardia: Secondary | ICD-10-CM | POA: Diagnosis not present

## 2018-01-19 NOTE — Patient Instructions (Addendum)
Medication Instructions:   No medication changes made  Labwork:  No new labs needed  Testing/Procedures:  Ask Dr. Arlana Pouchate about free Medicare lung CT chest  For folks with hx of smoking   Follow-Up: It was a pleasure seeing you in the office today. Please call us if you have new issues that need to be addressed before your next appt.  [phone]  Your physician wants you to follow-up in:  As needed  If you need a refill on your cardiac medications before your next appointment, please call your pharmacy.  For educational health videos Log in to : www.myemmi.com Or : [email or web address], password : [email or web address]

## 2018-04-23 ENCOUNTER — Ambulatory Visit (INDEPENDENT_AMBULATORY_CARE_PROVIDER_SITE_OTHER): Payer: Medicare Other | Admitting: Nurse Practitioner

## 2018-04-23 ENCOUNTER — Encounter: Payer: Self-pay | Admitting: Nurse Practitioner

## 2018-04-23 VITALS — BP 142/82 | HR 66 | Ht 61.0 in | Wt 165.0 lb

## 2018-04-23 DIAGNOSIS — Z720 Tobacco use: Secondary | ICD-10-CM

## 2018-04-23 DIAGNOSIS — R002 Palpitations: Secondary | ICD-10-CM | POA: Diagnosis not present

## 2018-04-23 MED ORDER — BISOPROLOL FUMARATE 5 MG PO TABS: 2.5000 mg | ORAL_TABLET | Freq: Every day | ORAL | 2 refills | Status: DC

## 2018-04-23 NOTE — Progress Notes (Signed)
Office Visit    Patient Name: Donna Frazier Date of Encounter: 04/23/2018  Primary Care Provider:  Jaclyn Shaggyate, Denny C, MD Primary Cardiologist:  Julien Nordmannimothy Gollan, MD  Chief Complaint    72 y/o ? with a h/o palpitations and PSVT, HL, hypothyroidism, OA/DDD, and tob abuse, who presents for f/u related to palpitations.  Past Medical History    Past Medical History:  Diagnosis Date  . DDD (degenerative disc disease), cervical   . Hx of peptic ulcer   . Hyperlipidemia   . Hypothyroidism   . Migraine   . Osteoarthritis   . Palpitations   . PSVT (paroxysmal supraventricular tachycardia) (HCC)    a. Previous holter->PAC's and short runs of SVT.  . Tobacco abuse   . Trigger finger    Past Surgical History:  Procedure Laterality Date  . KNEE SURGERY      Allergies  Allergies  Allergen Reactions  . Aspirin     Stomach ulcer   . Motrin [Ibuprofen]     Stomach ulcer     History of Present Illness    72 y/o ? w/ the above complex PMH including palpitations/PSVT prev on  blocker therapy, HL, tob abuse, hypothyroidism, PUD/GERD, and OA/DDD.  As noted, she was previously diagnosed with PACs and SVT on a remote Holter monitor.  She had been on propranolol therapy but says she came off of this many many years ago as she was doing well without symptoms of palpitations.  She was last seen in clinic by Dr. Mariah MillingGollan in August, at which time she was doing well.  Over the past month or so however, she has been noticing almost nightly episodes of palpitations which she describes as frequent skipped beats several times an hour, typically occurring at rest.  Symptoms are occurring somewhere between 7 PM and midnight most nights.  Other than noticing the palpitations most frequently up in her neck, she does not experience chest pain, dyspnea, PND, orthopnea, dizziness, syncope, edema, or early satiety.  She continues to smoke a pack a day.  She says she is just not ready to quit yet.  She is a  primary caregiver for 72 year old husband.  Home Medications    Prior to Admission medications   Medication Sig Start Date End Date Taking? Authorizing Provider  atorvastatin (LIPITOR) 10 MG tablet Take 10 mg by mouth daily.   Yes [provider]  Cyanocobalamin (B-12 PO) Take by mouth daily.   Yes [provider]  levothyroxine (SYNTHROID, LEVOTHROID) 75 MCG tablet Take one tablet on Mon. Tues. Wed. Thurs & Sun., 1/2 tablet Friday & Sat.   Yes [provider]  Multiple Vitamin (MULTIVITAMIN) capsule Take 1 capsule by mouth daily.   Yes [provider]  ranitidine (ZANTAC) 150 MG capsule Take 150 mg by mouth 2 (two) times daily.   Yes [provider]    Review of Systems    Palpitations as outlined above.  She denies chest pain, dyspnea, PND, orthopnea, dizziness, syncope, edema, or early satiety.  All other systems reviewed and are otherwise negative except as noted above.  Physical Exam    VS:  BP (!) 142/82 (BP Location: Left Arm, Patient Position: Sitting, Cuff Size: Normal)   Pulse 66   Ht 5\' 1"  (1.549 m)   Wt 165 lb (74.8 kg)   BMI 31.18 kg/m  , BMI Body mass index is 31.18 kg/m. GEN: Well nourished, well developed, in no acute distress. HEENT: normal. Neck: Supple, no  JVD, carotid bruits, or masses. Cardiac: RRR, no murmurs, rubs, or gallops. No clubbing, cyanosis, edema.  Radials/DP/PT 2+ and equal bilaterally.  Respiratory:  Respirations regular and unlabored, clear to auscultation bilaterally. GI: Soft, nontender, nondistended, BS + x 4. MS: no deformity or atrophy. Skin: warm and dry, no rash. Neuro:  Strength and sensation are intact. Psych: Normal affect.  Accessory Clinical Findings    ECG personally reviewed by me today -regular sinus rhythm, 66 - no acute changes.  Assessment & Plan    1.  Palpitations: Patient with prior history of palpitations which were noted to be PACs and short runs of PSVT on remotely  performed Holter monitoring.  She had previously been on propranolol therapy but says that she came off this many years ago.  She had been doing well but over the past month, she has noticed an increase in palpitations, occurring intermittently over about a 5-hour span each night.  These are described as skipped beats.  Her ECG shows sinus rhythm today without any ectopy.  She says she had labs recently at primary care which included reportedly normal electrolytes and thyroid function.  I will obtain a 48-hour Holter monitor and given her smoking history with presumed COPD, will add bisoprolol 2.5 mg daily to start.  We can consider titrating further depending upon response.  2.  Tobacco abuse: Still smoking a pack a day.  She says simply that she is not ready to quit yet.  We discussed the importance of cessation and I asked her to consider pharmacologic aids if necessary.  We collectively agreed however that until she is mentally ready to quit, she is not likely to be successful.  She is the primary caregiver for 10 year old husband and this appears to be a stressful trigger that causes her to reach her cigarettes.  3.  Hyperlipidemia: On statin therapy and followed by primary care.  4.  Hypothyroidism: She reports recently normal thyroid function with lab work about a month ago at primary care.  She is on Synthroid.  5.  Disposition: Follow-up 48-hour Holter.  Follow-up in clinic in 3 to 4 weeks or sooner if necessary.   Nicolasa Ducking, NP 04/23/2018, 12:31 PM

## 2018-04-23 NOTE — Patient Instructions (Addendum)
Medication Instructions:  Your physician has recommended you make the following change in your medication:  1- START Bisoprolol 2.5 mg (0.5 tablet) by mouth once a day.  If you need a refill on your cardiac medications before your next appointment, please call your pharmacy.   Lab work: none If you have labs (blood work) drawn today and your tests are completely normal, you will receive your results only by: Marland Kitchen. MyChart Message (if you have MyChart) OR . A paper copy in the mail If you have any lab test that is abnormal or we need to change your treatment, we will call you to review the results.  Testing/Procedures: Your physician has recommended that you wear a 48 hour holter monitor. Holter monitors are medical devices that record the heart's electrical activity. Doctors most often use these monitors to diagnose arrhythmias. Arrhythmias are problems with the speed or rhythm of the heartbeat. The monitor is a small, portable device. You can wear one while you do your normal daily activities. This is usually used to diagnose what is causing palpitations/syncope (passing out).    Follow-Up: At [facility], you and your health needs are our priority.  As part of our continuing mission to provide you with exceptional heart care, we have created designated Provider Care Teams.  These Care Teams include your primary Cardiologist (physician) and Advanced Practice Providers (APPs -  Physician Assistants and Nurse Practitioners) who all work together to provide you with the care you need, when you need it. You will need a follow up appointment in 3 weeks.  You may see Julien Nordmannimothy Gollan, MD or Nicolasa Duckinghristopher Berge, NP.

## 2018-05-04 ENCOUNTER — Ambulatory Visit (INDEPENDENT_AMBULATORY_CARE_PROVIDER_SITE_OTHER): Payer: Medicare Other

## 2018-05-04 DIAGNOSIS — R002 Palpitations: Secondary | ICD-10-CM | POA: Diagnosis not present

## 2018-05-07 ENCOUNTER — Ambulatory Visit
Admission: RE | Admit: 2018-05-07 | Discharge: 2018-05-07 | Disposition: A | Discharge status: Home / Self Care | Payer: Medicare Other | Source: Ambulatory Visit | Attending: Nurse Practitioner | Admitting: Nurse Practitioner

## 2018-05-07 DIAGNOSIS — R000 Tachycardia, unspecified: Secondary | ICD-10-CM | POA: Insufficient documentation

## 2018-05-07 DIAGNOSIS — R002 Palpitations: Secondary | ICD-10-CM | POA: Diagnosis present

## 2018-05-15 ENCOUNTER — Encounter: Payer: Self-pay | Admitting: Nurse Practitioner

## 2018-05-15 ENCOUNTER — Ambulatory Visit (INDEPENDENT_AMBULATORY_CARE_PROVIDER_SITE_OTHER): Payer: Medicare Other | Admitting: Nurse Practitioner

## 2018-05-15 ENCOUNTER — Telehealth: Payer: Self-pay

## 2018-05-15 VITALS — BP 110/62 | HR 66 | Ht 61.0 in | Wt 162.5 lb

## 2018-05-15 DIAGNOSIS — R002 Palpitations: Secondary | ICD-10-CM | POA: Diagnosis not present

## 2018-05-15 DIAGNOSIS — I493 Ventricular premature depolarization: Secondary | ICD-10-CM | POA: Diagnosis not present

## 2018-05-15 MED ORDER — BISOPROLOL FUMARATE 5 MG PO TABS: 5.0000 mg | ORAL_TABLET | Freq: Every day | ORAL | 2 refills | Status: DC

## 2018-05-15 NOTE — Telephone Encounter (Signed)
Error

## 2018-05-15 NOTE — Patient Instructions (Signed)
Medication Instructions:  Your physician has recommended you make the following change in your medication:  1- Zebeta: Take 1 tablet (5 mg total) by mouth daily.,  If you need a refill on your cardiac medications before your next appointment, please call your pharmacy.   Lab work: None ordered   If you have labs (blood work) drawn today and your tests are completely normal, you will receive your results only by: Marland Kitchen. MyChart Message (if you have MyChart) OR . A paper copy in the mail If you have any lab test that is abnormal or we need to change your treatment, we will call you to review the results.  Testing/Procedures: None ordered   Follow-Up: At [facility], you and your health needs are our priority.  As part of our continuing mission to provide you with exceptional heart care, we have created designated Provider Care Teams.  These Care Teams include your primary Cardiologist (physician) and Advanced Practice Providers (APPs -  Physician Assistants and Nurse Practitioners) who all work together to provide you with the care you need, when you need it. You will need a follow up appointment in 6 months.  Please call our office 2 months in advance to schedule this appointment.  You may see Julien Nordmannimothy Gollan, MD or one of the following Advanced Practice Providers on your designated Care Team:   Nicolasa Duckinghristopher Berge, NP Eula Listenyan Dunn, PA-C . Marisue IvanJacquelyn Visser, PA-C

## 2018-05-15 NOTE — Progress Notes (Signed)
Office Visit    Patient Name: Donna Frazier Date of Encounter: 05/15/2018  Primary Care Provider:  Jaclyn Shaggy, MD Primary Cardiologist:  Julien Nordmann, MD  Chief Complaint    72 year old female with a history of palpitations, PSVT, hyperlipidemia, hypothyroidism, osteoarthritis/degenerative disc disease, COPD, and ongoing tobacco abuse, who presents for follow-up related to palpitations.  Past Medical History    Past Medical History:  Diagnosis Date  . DDD (degenerative disc disease), cervical   . Hx of peptic ulcer   . Hyperlipidemia   . Hypothyroidism   . Migraine   . Osteoarthritis   . Palpitations   . PSVT (paroxysmal supraventricular tachycardia) (HCC)    a. Previous holter->PAC's and short runs of SVT; b. 05/2018 48hr holter: Avg HR 55 (48-113). <1% pvcs and pacs.  . Tobacco abuse   . Trigger finger    Past Surgical History:  Procedure Laterality Date  . KNEE SURGERY      Allergies  Allergies  Allergen Reactions  . Aspirin     Stomach ulcer   . Motrin [Ibuprofen]     Stomach ulcer     History of Present Illness    72 year old female with the above complex past medical history including palpitations/PSVT, hyperlipidemia, ongoing tobacco abuse, COPD, hypothyroidism, peptic ulcer disease, GERD, and osteoarthritis/degenerative disc disease.  She had previous been treated with propranolol therapy in the setting of documented PACs and SVT on remote Holter monitoring but came off of it several years ago in the absence of symptoms.  She followed up on November 21 due to recurrent palpitations, occurring almost nightly and described as skipped beats.  She had labs performed with primary care that were reportedly normal.  I placed a Holter monitor, which showed frequent PACs and PVCs but each accounting for less than 1% of total beats.  We also started bisoprolol 2.5 mg daily.  She did note occasional palpitations while wearing the monitor but since its been  removed, she is only had 2 isolated skipped beats and overall has been tolerating bisoprolol well.  She has been having trouble cutting in half however.  She denies chest pain, dyspnea, PND, orthopnea, dizziness, syncope, edema, or early satiety.   Home Medications    Prior to Admission medications   Medication Sig Start Date End Date Taking? Authorizing Provider  atorvastatin (LIPITOR) 10 MG tablet Take 10 mg by mouth daily.   Yes [provider]  bisoprolol (ZEBETA) 5 MG tablet Take 0.5 tablets (2.5 mg total) by mouth daily. 04/23/18  Yes Creig Hines, NP  Cyanocobalamin (B-12 PO) Take by mouth daily.   Yes [provider]  levothyroxine (SYNTHROID, LEVOTHROID) 75 MCG tablet Take one tablet on Mon. Tues. Wed. Thurs & Sun., 1/2 tablet Friday & Sat.   Yes [provider]  Multiple Vitamin (MULTIVITAMIN) capsule Take 1 capsule by mouth daily.   Yes [provider]  ranitidine (ZANTAC) 150 MG capsule Take 150 mg by mouth 2 (two) times daily.   Yes [provider]    Review of Systems    Palpitations significantly improved.  She denies chest pain, palpitations, dyspnea, pnd, orthopnea, n, v, dizziness, syncope, edema, weight gain, or early satiety.  All other systems reviewed and are otherwise negative except as noted above.  Physical Exam    VS:  BP 110/62 (BP Location: Left Arm, Patient Position: Sitting, Cuff Size: Normal)   Pulse 66   Ht 5\' 1"  (1.549 m)   Wt 162  lb 8 oz (73.7 kg)   BMI 30.70 kg/m  , BMI Body mass index is 30.7 kg/m. GEN: Well nourished, well developed, in no acute distress. HEENT: normal. Neck: Supple, no JVD, carotid bruits, or masses. Cardiac: RRR, no murmurs, rubs, or gallops. No clubbing, cyanosis, edema.  Radials/DP/PT 2+ and equal bilaterally.  Respiratory:  Respirations regular and unlabored, coarse breath sounds bilaterally. GI: Soft, nontender, nondistended, BS + x 4. MS: no deformity or  atrophy. Skin: warm and dry, no rash. Neuro:  Strength and sensation are intact. Psych: Normal affect.  Accessory Clinical Findings    ECG personally reviewed by me today -regular sinus rhythm, 66- no acute changes.  Assessment & Plan    1.  Palpitations/PACs and PVCs: Patient with prior history of palpitations and short runs of PSVT on remote Holter monitoring, who was recently evaluated due to recurrent palpitations off of beta-blocker therapy.  I resume bisoprolol 2.5 mg daily and also obtained a 48-hour Holter monitor, which did show frequent PACs and PVCs but each accounted for just less than 1% of total beats.  Symptoms have improved on beta-blocker therapy.  She has had trouble cutting it in half and therefore we will increase to 5 mg daily.  She will remain on the look out for hypotension, bradycardia, or wheezing.  2.  Tobacco abuse: Still smoking a pack a day.  Complete cessation advised.  3.  Hyperlipidemia: On statin therapy and followed by primary care.  4.  Hypothyroidism: She remains on Synthroid and recently had labs with primary care which she believes were normal.  5.  Disposition: Follow-up in clinic in 6 months or sooner if necessary.   Nicolasa Duckinghristopher Etienne Millward, NP 05/15/2018, 3:43 PM

## 2018-08-03 ENCOUNTER — Encounter (INDEPENDENT_AMBULATORY_CARE_PROVIDER_SITE_OTHER): Payer: Self-pay | Admitting: Orthopaedic Surgery

## 2018-08-03 ENCOUNTER — Ambulatory Visit (INDEPENDENT_AMBULATORY_CARE_PROVIDER_SITE_OTHER): Payer: Medicare Other

## 2018-08-03 ENCOUNTER — Ambulatory Visit (INDEPENDENT_AMBULATORY_CARE_PROVIDER_SITE_OTHER): Payer: Medicare Other | Admitting: Orthopaedic Surgery

## 2018-08-03 DIAGNOSIS — M545 Low back pain, unspecified: Secondary | ICD-10-CM

## 2018-08-03 MED ORDER — METHOCARBAMOL 500 MG PO TABS: 500.0000 mg | ORAL_TABLET | Freq: Every day | ORAL | 0 refills | Status: DC

## 2018-08-03 MED ORDER — METHYLPREDNISOLONE 4 MG PO TABS
ORAL_TABLET | ORAL | 0 refills | Status: DC
Start: 2018-08-03 — End: 2019-04-20

## 2018-08-03 NOTE — Progress Notes (Signed)
Office Visit Note   Patient: Sharnice Klauck           Date of Birth: 10/03/1945           MRN: [id number] Visit Date: 08/03/2018              Requested by: Jaclyn Shaggy, MD [address]   [place], [place] [address] PCP: Jaclyn Shaggy, MD   Assessment & Plan: Visit Diagnoses:  1. Acute midline low back pain without sciatica     Plan: We will send her to physical therapy for range of motion strengthening core exercises home exercise program and modalities.  Also placed on a Medrol Dosepak and give her Robaxin to take at night.  See her back in a month to check her progress or lack of.  If her symptoms become worse or she develops radicular symptoms she will return sooner.  Questions encouraged and answered.  Follow-Up Instructions: Return in about 4 weeks (around 08/31/2018).   Orders:  Orders Placed This Encounter  Procedures  . XR Lumbar Spine 2-3 Views   Meds ordered this encounter  Medications  . methocarbamol (ROBAXIN) 500 MG tablet    Sig: Take 1 tablet (500 mg total) by mouth at bedtime.    Dispense:  30 tablet    Refill:  0  . methylPREDNISolone (MEDROL) 4 MG tablet    Sig: Take as directed    Dispense:  21 tablet    Refill:  0      Procedures: No procedures performed   Clinical Data: No additional findings.   Subjective: Chief Complaint  Patient presents with  . Right Hip - Pain    HPI Mrs. Mckelvie comes in today with a complaint of low back pain that is been ongoing for the past month no known injury.  No awakening pain.  No bowel bladder dysfunction.  She describes no radicular symptoms or saddle anesthesia like symptoms.  She is tried some heat to her low back without any real relief.  Otherwise no treatment. Review of Systems See HPI  Objective: Vital Signs: There were no vitals taken for this visit.  Physical Exam Constitutional:      Appearance: She is not ill-appearing or diaphoretic.  Pulmonary:     Effort: Pulmonary effort is  normal.  Neurological:     Mental Status: She is alert and oriented to person, place, and time.  Psychiatric:        Mood and Affect: Mood normal.     Ortho Exam Positive straight leg raise bilaterally.  She has 5 out of 5 strength throughout lower extremities except for flexion of the left hip which is 4 out of 5 strength against resistance.  Tight hamstrings bilaterally.  She comes within a few inches of being able to touch her toes.  She has limited extension of the lumbar spine with pain. Specialty Comments:  No specialty comments available.  Imaging: No results found.   PMFS History: Patient Active Problem List   Diagnosis Date Noted  . Finger mass, left 04/10/2017  . Palpitations 07/24/2011  . SVT (supraventricular tachycardia) (HCC) 07/24/2011  . Smoking 07/24/2011  . Hypothyroidism 07/24/2011   Past Medical History:  Diagnosis Date  . DDD (degenerative disc disease), cervical   . Hx of peptic ulcer   . Hyperlipidemia   . Hypothyroidism   . Migraine   . Osteoarthritis   . Palpitations   . PSVT (paroxysmal supraventricular tachycardia) (HCC)  a. Previous holter->PAC's and short runs of SVT; b. 05/2018 48hr holter: Avg HR 55 (48-113). <1% pvcs and pacs.  . Tobacco abuse   . Trigger finger     Family History  Problem Relation Age of Onset  . Heart attack Father 54  . Breast cancer Neg Hx     Past Surgical History:  Procedure Laterality Date  . KNEE SURGERY     Social History   Occupational History  . Not on file  Tobacco Use  . Smoking status: Current Every Day Smoker    Packs/day: 1.00    Years: 45.00    Pack years: 45.00    Types: Cigarettes  . Smokeless tobacco: Current User    Types: Snuff  Substance and Sexual Activity  . Alcohol use: Not Currently    Alcohol/week: 1.0 standard drinks    Types: 1 Standard drinks or equivalent per week  . Drug use: No  . Sexual activity: Not on file

## 2018-09-03 ENCOUNTER — Ambulatory Visit (INDEPENDENT_AMBULATORY_CARE_PROVIDER_SITE_OTHER): Payer: Medicare Other | Admitting: Orthopaedic Surgery

## 2018-11-25 ENCOUNTER — Other Ambulatory Visit: Payer: Self-pay

## 2018-11-25 ENCOUNTER — Encounter: Payer: Self-pay | Admitting: Orthopaedic Surgery

## 2018-11-25 ENCOUNTER — Ambulatory Visit (INDEPENDENT_AMBULATORY_CARE_PROVIDER_SITE_OTHER): Payer: Medicare Other | Admitting: Orthopaedic Surgery

## 2018-11-25 DIAGNOSIS — M5431 Sciatica, right side: Secondary | ICD-10-CM | POA: Diagnosis not present

## 2018-11-25 NOTE — Progress Notes (Signed)
The patient comes in today with continued right-sided sciatica and pain in the posterior pelvis and ischium on the right side.  Sometimes she gets radicular symptoms down the side of her right leg.  She says right now she can live with it and just an annoying pain but is not debilitating.  She denies any numbness and tingling in her feet or any weakness in her legs.  She is a very active 73 year old and gets up in the chair easily onto the exam table easily.  On exam she has normal motion of all joints in her lower extremities.  She has 5 out of 5 strength of all muscle groups.  Her sensation is normal as well.  She does have tight hamstrings with limited flexion extension lumbar spine in terms of forward flexion but overall she is doing well.  Previous spine films showed some degenerative changes in the lower posterior elements of her lumbar spine.  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  This point since she is doing well, follow-up can be as needed and there is no other intervention I would recommend since she is not having severe symptoms.  Obviously we can always set her up for an intervention with Dr. Ernestina Patches in the lumbar spine if her symptoms worsen in any way.  She will let us know.  All question concerns were answered and addressed.

## 2019-02-24 ENCOUNTER — Other Ambulatory Visit: Payer: Self-pay | Admitting: Internal Medicine

## 2019-02-24 DIAGNOSIS — Z1231 Encounter for screening mammogram for malignant neoplasm of breast: Secondary | ICD-10-CM

## 2019-03-16 ENCOUNTER — Other Ambulatory Visit: Payer: Self-pay | Admitting: *Deleted

## 2019-03-16 MED ORDER — BISOPROLOL FUMARATE 5 MG PO TABS: 5.0000 mg | ORAL_TABLET | Freq: Every day | ORAL | 0 refills | Status: DC

## 2019-03-18 ENCOUNTER — Other Ambulatory Visit: Payer: Self-pay

## 2019-03-18 MED ORDER — BISOPROLOL FUMARATE 5 MG PO TABS: 5.0000 mg | ORAL_TABLET | Freq: Every day | ORAL | 0 refills | Status: DC

## 2019-03-30 ENCOUNTER — Ambulatory Visit
Admission: RE | Admit: 2019-03-30 | Discharge: 2019-03-30 | Disposition: A | Discharge status: Home / Self Care | Payer: Medicare Other | Source: Ambulatory Visit | Attending: Internal Medicine | Admitting: Internal Medicine

## 2019-03-30 DIAGNOSIS — Z1231 Encounter for screening mammogram for malignant neoplasm of breast: Secondary | ICD-10-CM | POA: Diagnosis not present

## 2019-04-17 NOTE — Progress Notes (Signed)
Cardiology Office Note  Date:  04/20/2019   ID:  Donna Frazier, DOB February 22, 1946, MRN [id number]  PCP:  Albina Billet, MD   Chief Complaint  Patient presents with  . other    12 month follow up. Meds reviewed by the pt. verbally. "doing well." Pt. has an infected tooth and on an antibiotic.     HPI:  Donna Frazier is a 73 year-old woman with past medical history of palpitations hypothyroidism  SVT on holter Hx of GI ulcers smokes one pack per day for many years Who presents for follow-up of her SVT and shortness of breath  Stress at home, husband with disability She started getting migraines,  Was prescribed Fioricet for headaches which helped her symptoms  Denies having significant arrhythmia Takes propranolol as needed No exercise program  Continues to smoke Denies significant shortness of breath or chest pain  Lab work reviewed  Total cholesterol 151 LDL 78 hemoglobin A1c 5.4  EKG personally reviewed by myself on todays visit Shows normal sinus rhythm rate 58 beats per minute no significant ST-T wave changes  Previous Holter monitor was ordered by Dr. Hall Busing that showed frequent episodes of APCs and short runs of SVT  Family hx Father with MI Mother with ETOH  PMH:   has a past medical history of DDD (degenerative disc disease), cervical, peptic ulcer, Hyperlipidemia, Hypothyroidism, Migraine, Osteoarthritis, Palpitations, PSVT (paroxysmal supraventricular tachycardia) ([facility]), Tobacco abuse, and Trigger finger.  PSH:    Past Surgical History:  Procedure Laterality Date  . KNEE SURGERY      Current Outpatient Medications  Medication Sig Dispense Refill  . amoxicillin (AMOXIL) 500 MG tablet Take 500 mg by mouth 2 (two) times daily.    Marland Kitchen atorvastatin (LIPITOR) 10 MG tablet Take 10 mg by mouth daily.    . bisoprolol (ZEBETA) 5 MG tablet Take 1 tablet (5 mg total) by mouth daily. 30 tablet 0  . Cyanocobalamin (B-12 PO) Take by mouth daily.    . famotidine (PEPCID)  20 MG tablet Take 20 mg by mouth 2 (two) times daily.    Marland Kitchen levothyroxine (SYNTHROID, LEVOTHROID) 75 MCG tablet Take one tablet on Mon. Tues. Wed. Thurs & Sun., 1/2 tablet Friday & Sat.    . Multiple Vitamin (MULTIVITAMIN) capsule Take 1 capsule by mouth daily.    . propranolol (INDERAL) 10 MG tablet Take 10 mg by mouth at bedtime.     No current facility-administered medications for this visit.     Allergies:   Aspirin and Motrin [ibuprofen]   Social History:  The patient  reports that she has been smoking cigarettes. She has a 45.00 pack-year smoking history. She has never used smokeless tobacco. She reports previous alcohol use of about 1.0 standard drinks of alcohol per week. She reports that she does not use drugs.   Family History:   family history includes Heart attack (age of onset: 77) in her father.   Review of Systems  Constitutional: Negative.   HENT: Negative.   Respiratory: Negative.   Cardiovascular: Negative.   Gastrointestinal: Negative.   Musculoskeletal: Negative.   Neurological: Negative.   Psychiatric/Behavioral: Negative.   All other systems reviewed and are negative.   PHYSICAL EXAM: VS:  BP 128/80 (BP Location: Left Arm, Patient Position: Sitting, Cuff Size: Normal)   Pulse (!) 58   Temp (!) 97.4 F (36.3 C)   Ht 5\' 1"  (1.549 m)   Wt 160 lb 8 oz (72.8 kg)   BMI 30.33 kg/m  ,  BMI Body mass index is 30.33 kg/m. Constitutional:  oriented to person, place, and time. No distress.  HENT:  Head: Grossly normal Eyes:  no discharge. No scleral icterus.  Neck: No JVD, no carotid bruits  Cardiovascular: Regular rate and rhythm, no murmurs appreciated Pulmonary/Chest: Clear to auscultation bilaterally, no wheezes or rails Abdominal: Soft.  no distension.  no tenderness.  Musculoskeletal: Normal range of motion Neurological:  normal muscle tone. Coordination normal. No atrophy Skin: Skin warm and dry Psychiatric: normal affect, pleasant   Recent Labs: No  results found for requested labs within last 8760 hours.    Lipid Panel No results found for: CHOL, HDL, LDLCALC, TRIG    Wt Readings from Last 3 Encounters:  04/20/19 160 lb 8 oz (72.8 kg)  05/15/18 162 lb 8 oz (73.7 kg)  04/23/18 165 lb (74.8 kg)     ASSESSMENT AND PLAN:  SVT (supraventricular tachycardia) (HCC) - Plan: EKG 12-Lead Takes propranolol as needed Denies having significant symptoms  Smoking No desire to quit We have encouraged her to continue to work on weaning her cigarettes and smoking cessation. She will continue to work on this and does not want any assistance with chantix.   Hypothyroidism, unspecified type Manage her primary care   Disposition:   F/U 12 months   Total encounter time more than 25 minutes  Greater than 50% was spent in counseling and coordination of care with the patient    Orders Placed This Encounter  Procedures  . EKG 12-Lead     Signed, Dossie Arbour, M.D., Ph.D. 04/20/2019  [facility] [place], [place] [phone]

## 2019-04-20 ENCOUNTER — Other Ambulatory Visit: Payer: Self-pay

## 2019-04-20 ENCOUNTER — Ambulatory Visit (INDEPENDENT_AMBULATORY_CARE_PROVIDER_SITE_OTHER): Payer: Medicare Other | Admitting: Cardiovascular Disease

## 2019-04-20 ENCOUNTER — Encounter: Payer: Self-pay | Admitting: Cardiovascular Disease

## 2019-04-20 VITALS — BP 128/80 | HR 58 | Temp 97.4°F | Ht 61.0 in | Wt 160.5 lb

## 2019-04-20 DIAGNOSIS — I493 Ventricular premature depolarization: Secondary | ICD-10-CM | POA: Diagnosis not present

## 2019-04-20 DIAGNOSIS — Z720 Tobacco use: Secondary | ICD-10-CM

## 2019-04-20 DIAGNOSIS — I471 Supraventricular tachycardia: Secondary | ICD-10-CM | POA: Diagnosis not present

## 2019-04-20 NOTE — Patient Instructions (Signed)

## 2019-06-07 ENCOUNTER — Telehealth: Payer: Self-pay | Admitting: Cardiovascular Disease

## 2019-06-07 NOTE — Telephone Encounter (Signed)
Patient states she is experiencing "pulsating in my neck". States she is taking Bisoprolol and does not seem to help. Please call to discuss. Patient has an appt with Hubbard Hartshorn, PA on 1/8.

## 2019-06-08 MED ORDER — BISOPROLOL FUMARATE 5 MG PO TABS: 5.0000 mg | ORAL_TABLET | Freq: Two times a day (BID) | ORAL | 0 refills | Status: DC

## 2019-06-08 NOTE — Telephone Encounter (Signed)
Spoke with patient and reviewed provider recommendations. She reports that she is not taking propranolol at this time. Reviewed to increase Bystolic to 5 mg twice daily and when she comes in they can review this with her. She verbalized understanding with no further questions at this time.

## 2019-06-08 NOTE — Telephone Encounter (Signed)
Could be having runs of SVT Could we increase her Bystolic up to 5 mg twice a day She can also take propranolol for breakthrough palpitations If symptoms continue we may need a monitor when she comes in for appointment in 3 days

## 2019-06-08 NOTE — Telephone Encounter (Signed)
Called patient. States she's been having a pulsating in her neck for the past 1-2 weeks. Comes and goes. May last a few seconds or up to 30 minutes. Feels like a palpitation on the inside but can't feel it on the outside. States she's had this before in the past. In 2019, she was prescribed Bisoprolol and that seemed to help but now it does not seem to be helping as well. Denies chest pain, shortness of breath of dizziness. Patient is scheduled with Gillian Shields, NP on 06/11/19.  Offered patient an appointment on 06/10/19; however, patient will keep the 1/8 and will let us know if symptoms worsen or new develop. Routing to Dr Mariah Milling for review.

## 2019-06-09 NOTE — Progress Notes (Signed)
Cardiology Office Note    Date:  06/10/2019   ID:  Donna Frazier, DOB 30-Jan-1946, MRN [id number]  PCP:  Jaclyn Shaggy, MD  Cardiologist:  Julien Nordmann, MD  Electrophysiologist:  None   Chief Complaint: Palpitations   History of Present Illness:   Donna Frazier is a 74 y.o. female with history of paroxysmal SVT, ongoing tobacco use, HLD, hypothyroidism, osteoarthritis, degenerative disc disease, peptic ulcer disease, and GERD who presents for evaluation of palpitations.  Patient was previously diagnosed with PACs and SVT on remote Holter monitoring and was managed on propanolol though had subsequently come off this as she had been doing quite well without symptoms of palpitations.  She was seen in 04/2018 noting increase in palpitations almost nightly and underwent 48-hour Holter monitoring at that time which demonstrated a predominant rhythm of normal sinus, rare PACs and PVCs representing less than 1% of total beats as well as a 10 beat run of atrial tachycardia.  Patient triggered events appear to correlate with PVCs.  She was started on bisoprolol with improvement in symptoms.  She was seen by her primary cardiologist for routine follow-up on 04/20/2019 and denied any significant palpitations.  She was continuing to take bisoprolol.  There was noted increased stress at home as well as migraines.  No medication changes were made at that time.  Patient called our office on 06/07/2019 noting palpitations described as "pulsations and her neck" for the past 1 to 2 weeks that were intermittent and may last a few seconds or up to 30 minutes.  She did not feel like the bisoprolol was helping as much.  Patient's primary cardiologist recommended increasing bisoprolol to 5 mg twice daily.  She comes in noting a 1 to 2-week history of intermittent pulsations in her neck that typically start up around 7 PM and last until she falls asleep.  These feel exactly the same as her palpitations in late 2019  which were evaluated with a 48-hour Holter monitor as outlined above.  No associated chest pain, shortness of breath, dizziness, presyncope, syncope.  No lower extremity swelling, abdominal distention, orthopnea, PND, early satiety, falls, BRBPR, or melena.  Of note, patient has recently started butalbital for headaches/migraines in the same timeframe which does contain caffeine.  Outside of the above palpitations she indicates that she feels great.     Labs independently reviewed: 02/2019 - TSH normal, free T4 normal, BUN 24, SCr 1.19, K+ 4.7, albumin 4.3, AST/ALT normal, TC 151, TG 211, HDL 38, LDL 78, A1c 5.4  Past Medical History:  Diagnosis Date  . DDD (degenerative disc disease), cervical   . Hx of peptic ulcer   . Hyperlipidemia   . Hypothyroidism   . Migraine   . Osteoarthritis   . Palpitations   . PSVT (paroxysmal supraventricular tachycardia) (HCC)    a. Previous holter->PAC's and short runs of SVT; b. 05/2018 48hr holter: Avg HR 55 (48-113). <1% pvcs and pacs.  . Tobacco abuse   . Trigger finger     Past Surgical History:  Procedure Laterality Date  . KNEE SURGERY      Current Medications: Current Meds  Medication Sig  . atorvastatin (LIPITOR) 10 MG tablet Take 10 mg by mouth daily.  . bisoprolol (ZEBETA) 5 MG tablet Take 1 tablet (5 mg total) by mouth 2 (two) times daily.  . butalbital-acetaminophen-caffeine (FIORICET) 50-325-40 MG tablet Take by mouth. Take 1-2 tablets every 8 hours as needed for headache.  . Cyanocobalamin (B-12 PO) Take  by mouth daily.  . famotidine (PEPCID) 20 MG tablet Take 20 mg by mouth 2 (two) times daily.  Marland Kitchen levothyroxine (SYNTHROID, LEVOTHROID) 75 MCG tablet Take one tablet on Mon. Tues. Wed. Thurs & Sun., 1/2 tablet Friday & Sat.  . Multiple Vitamin (MULTIVITAMIN) capsule Take 1 capsule by mouth daily.    Allergies:   Aspirin and Motrin [ibuprofen]   Social History   Socioeconomic History  . Marital status: Married    Spouse name: Not  on file  . Number of children: Not on file  . Years of education: Not on file  . Highest education level: Not on file  Occupational History  . Not on file  Tobacco Use  . Smoking status: Current Every Day Smoker    Packs/day: 1.00    Years: 45.00    Pack years: 45.00    Types: Cigarettes  . Smokeless tobacco: Never Used  Substance and Sexual Activity  . Alcohol use: Not Currently    Alcohol/week: 1.0 standard drinks    Types: 1 Standard drinks or equivalent per week  . Drug use: No  . Sexual activity: Not on file  Other Topics Concern  . Not on file  Social History Narrative  . Not on file   Social Determinants of Health   Financial Resource Strain:   . Difficulty of Paying Living Expenses: Not on file  Food Insecurity:   . Worried About Programme researcher, broadcasting/film/video in the Last Year: Not on file  . Ran Out of Food in the Last Year: Not on file  Transportation Needs:   . Lack of Transportation (Medical): Not on file  . Lack of Transportation (Non-Medical): Not on file  Physical Activity:   . Days of Exercise per Week: Not on file  . Minutes of Exercise per Session: Not on file  Stress:   . Feeling of Stress : Not on file  Social Connections:   . Frequency of Communication with Friends and Family: Not on file  . Frequency of Social Gatherings with Friends and Family: Not on file  . Attends Religious Services: Not on file  . Active Member of Clubs or Organizations: Not on file  . Attends Banker Meetings: Not on file  . Marital Status: Not on file     Family History:  The patient's family history includes Heart attack (age of onset: 87) in her father. There is no history of Breast cancer.  ROS:   Review of Systems  Constitutional: Negative for chills, diaphoresis, fever, malaise/fatigue and weight loss.  HENT: Negative for congestion.   Eyes: Negative for discharge and redness.  Respiratory: Negative for cough, hemoptysis, sputum production, shortness of  breath and wheezing.   Cardiovascular: Positive for palpitations. Negative for chest pain, orthopnea, claudication, leg swelling and PND.  Gastrointestinal: Negative for abdominal pain, heartburn, nausea and vomiting.  Musculoskeletal: Negative for falls and myalgias.  Skin: Negative for rash.  Neurological: Positive for headaches. Negative for dizziness, tingling, tremors, sensory change, speech change, focal weakness, loss of consciousness and weakness.  Endo/Heme/Allergies: Does not bruise/bleed easily.  Psychiatric/Behavioral: Negative for substance abuse. The patient is not nervous/anxious.   All other systems reviewed and are negative.    EKGs/Labs/Other Studies Reviewed:    Studies reviewed were summarized above. The additional studies were reviewed today:  48-hour Holter 05/2018: Normal sinus rhythm Rare PAC (#67) and PVC (#559), <1% of total beats, Short 10 beat run of atrial tachycardia Patient triggered events appear to correlate  with PVCs   EKG:  EKG is ordered today.  The EKG ordered today demonstrates sinus bradycardia, 53 bpm, normal axis, no acute ST-T changes  Recent Labs: No results found for requested labs within last 8760 hours.  Recent Lipid Panel No results found for: CHOL, TRIG, HDL, CHOLHDL, VLDL, LDLCALC, LDLDIRECT  PHYSICAL EXAM:    VS:  BP 130/80 (BP Location: Left Arm, Patient Position: Sitting, Cuff Size: Normal)   Pulse (!) 53   Ht 5\' 1"  (1.549 m)   Wt 160 lb 12 oz (72.9 kg)   SpO2 97%   BMI 30.37 kg/m   BMI: Body mass index is 30.37 kg/m.  Physical Exam  Constitutional: She is oriented to person, place, and time. She appears well-developed and well-nourished.  HENT:  Head: Normocephalic and atraumatic.  Eyes: Right eye exhibits no discharge. Left eye exhibits no discharge.  Neck: No JVD present.  Cardiovascular: Regular rhythm, S1 normal, S2 normal and normal heart sounds. Bradycardia present. Exam reveals no distant heart sounds, no  friction rub, no midsystolic click and no opening snap.  No murmur heard. Pulses:      Posterior tibial pulses are 2+ on the right side and 2+ on the left side.  Pulmonary/Chest: Effort normal and breath sounds normal. No respiratory distress. She has no decreased breath sounds. She has no wheezes. She has no rales. She exhibits no tenderness.  Abdominal: Soft. She exhibits no distension. There is no abdominal tenderness.  Musculoskeletal:        General: No edema.     Cervical back: Normal range of motion.  Neurological: She is alert and oriented to person, place, and time.  Skin: Skin is warm and dry. No cyanosis. Nails show no clubbing.  Psychiatric: She has a normal mood and affect. Her speech is normal and behavior is normal. Judgment and thought content normal.    Wt Readings from Last 3 Encounters:  06/10/19 160 lb 12 oz (72.9 kg)  04/20/19 160 lb 8 oz (72.8 kg)  05/15/18 162 lb 8 oz (73.7 kg)     ASSESSMENT & PLAN:   1. Palpitations: Prior 48-hour Holter monitor done for the same symptoms showed a predominant rhythm of sinus with a 10 beat atrial run along with rare PVCs and PACs representing a less than 1% burden.  Patient has recently started a caffeine-containing medication which may be contributing to her symptoms.  She will need to discuss this with her PCP.  We will place a 3-day Zio patch on the patient to evaluate for significant arrhythmia and quantify any ectopy burden.  Continue recently titrated dose of bisoprolol 5 mg twice daily.  She is bradycardic in the office today though is asymptomatic.  We will be able to evaluate chronotropic response on the above Zio patch.  Recent labs demonstrated potassium at goal and normal thyroid function.  We will also schedule the patient for 2D surface echo to quantify for any structural abnormalities.  2. Paroxysmal SVT/PVCs: As above, plan for Zio patch.  Underlying sinus bradycardia precludes escalation of bisoprolol at this time.   Await outpatient cardiac monitoring for further recommendations.  Continue bisoprolol as above.  3. HLD: LDL of 78 from 02/2019.  Remains on atorvastatin 10 mg daily.  Followed by PCP.  4. Tobacco use: Complete cessation is recommended.  5. Hypothyroidism: Thyroid function studies normal in 02/2019.  Remains on replacement therapy, followed by PCP.  Disposition: F/u with Dr. 03/2019 or an APP in 2 to 3  weeks.   Medication Adjustments/Labs and Tests Ordered: Current medicines are reviewed at length with the patient today.  Concerns regarding medicines are outlined above. Medication changes, Labs and Tests ordered today are summarized above and listed in the Patient Instructions accessible in Encounters.   Signed, Christell Faith, PA-C 06/10/2019 1:34 PM     [address] [place], [place] [address] [phone]

## 2019-06-10 ENCOUNTER — Encounter: Payer: Self-pay | Admitting: Physician Assistant

## 2019-06-10 ENCOUNTER — Other Ambulatory Visit: Payer: Self-pay

## 2019-06-10 ENCOUNTER — Ambulatory Visit (INDEPENDENT_AMBULATORY_CARE_PROVIDER_SITE_OTHER): Payer: Medicare Other | Admitting: Physician Assistant

## 2019-06-10 ENCOUNTER — Ambulatory Visit: Payer: Medicare Other

## 2019-06-10 VITALS — BP 130/80 | HR 53 | Ht 61.0 in | Wt 160.8 lb

## 2019-06-10 DIAGNOSIS — R002 Palpitations: Secondary | ICD-10-CM | POA: Diagnosis not present

## 2019-06-10 DIAGNOSIS — I493 Ventricular premature depolarization: Secondary | ICD-10-CM | POA: Diagnosis not present

## 2019-06-10 DIAGNOSIS — F17200 Nicotine dependence, unspecified, uncomplicated: Secondary | ICD-10-CM

## 2019-06-10 DIAGNOSIS — I471 Supraventricular tachycardia: Secondary | ICD-10-CM

## 2019-06-10 DIAGNOSIS — E782 Mixed hyperlipidemia: Secondary | ICD-10-CM | POA: Diagnosis not present

## 2019-06-10 DIAGNOSIS — E039 Hypothyroidism, unspecified: Secondary | ICD-10-CM

## 2019-06-10 NOTE — Patient Instructions (Addendum)
Medication Instructions:  Your physician recommends that you continue on your current medications as directed. Please refer to the Current Medication list given to you today.  *If you need a refill on your cardiac medications before your next appointment, please call your pharmacy*  Lab Work: None ordered  If you have labs (blood work) drawn today and your tests are completely normal, you will receive your results only by: Marland Kitchen MyChart Message (if you have MyChart) OR . A paper copy in the mail If you have any lab test that is abnormal or we need to change your treatment, we will call you to review the results.  Testing/Procedures: A zio monitor was placed today. It will remain on for 3 days. You will then return monitor and event diary in provided box. It takes 1-2 weeks for report to be downloaded and returned to [place]. We will call you with the results. If monitor falls of or has orange flashing light, please call Zio for further instructions.   Echo  Please return to [facility] on ______________ at _______________ AM/PM for an Echocardiogram. Your physician has requested that you have an echocardiogram. Echocardiography is a painless test that uses sound waves to create images of your heart. It provides your doctor with information about the size and shape of your heart and how well your heart's chambers and valves are working. This procedure takes approximately one hour. There are no restrictions for this procedure. Please note; depending on visual quality an IV may need to be placed.    Follow-Up: At [facility], you and your health needs are our priority.  As part of our continuing mission to provide you with exceptional heart care, we have created designated Provider Care Teams.  These Care Teams include your primary Cardiologist (physician) and Advanced Practice Providers (APPs -  Physician Assistants and Nurse Practitioners) who all work together to provide you with the  care you need, when you need it.  Your next appointment:   2-3 week(s)  The format for your next appointment:   In Person  Provider:    You may see Julien Nordmann, MD or Eula Listen, PA-C.

## 2019-06-11 ENCOUNTER — Ambulatory Visit: Payer: Medicare Other | Admitting: Family

## 2019-06-21 IMAGING — CR DG SHOULDER 2+V*L*
3 series · 3 of 3 positions shown · non-contrast
Comparison: None.

CLINICAL DATA: Status post fall out of bed, with left shoulder
pain. Initial encounter.

EXAM:
LEFT SHOULDER - 2+ VIEW

[shoulder grashey]
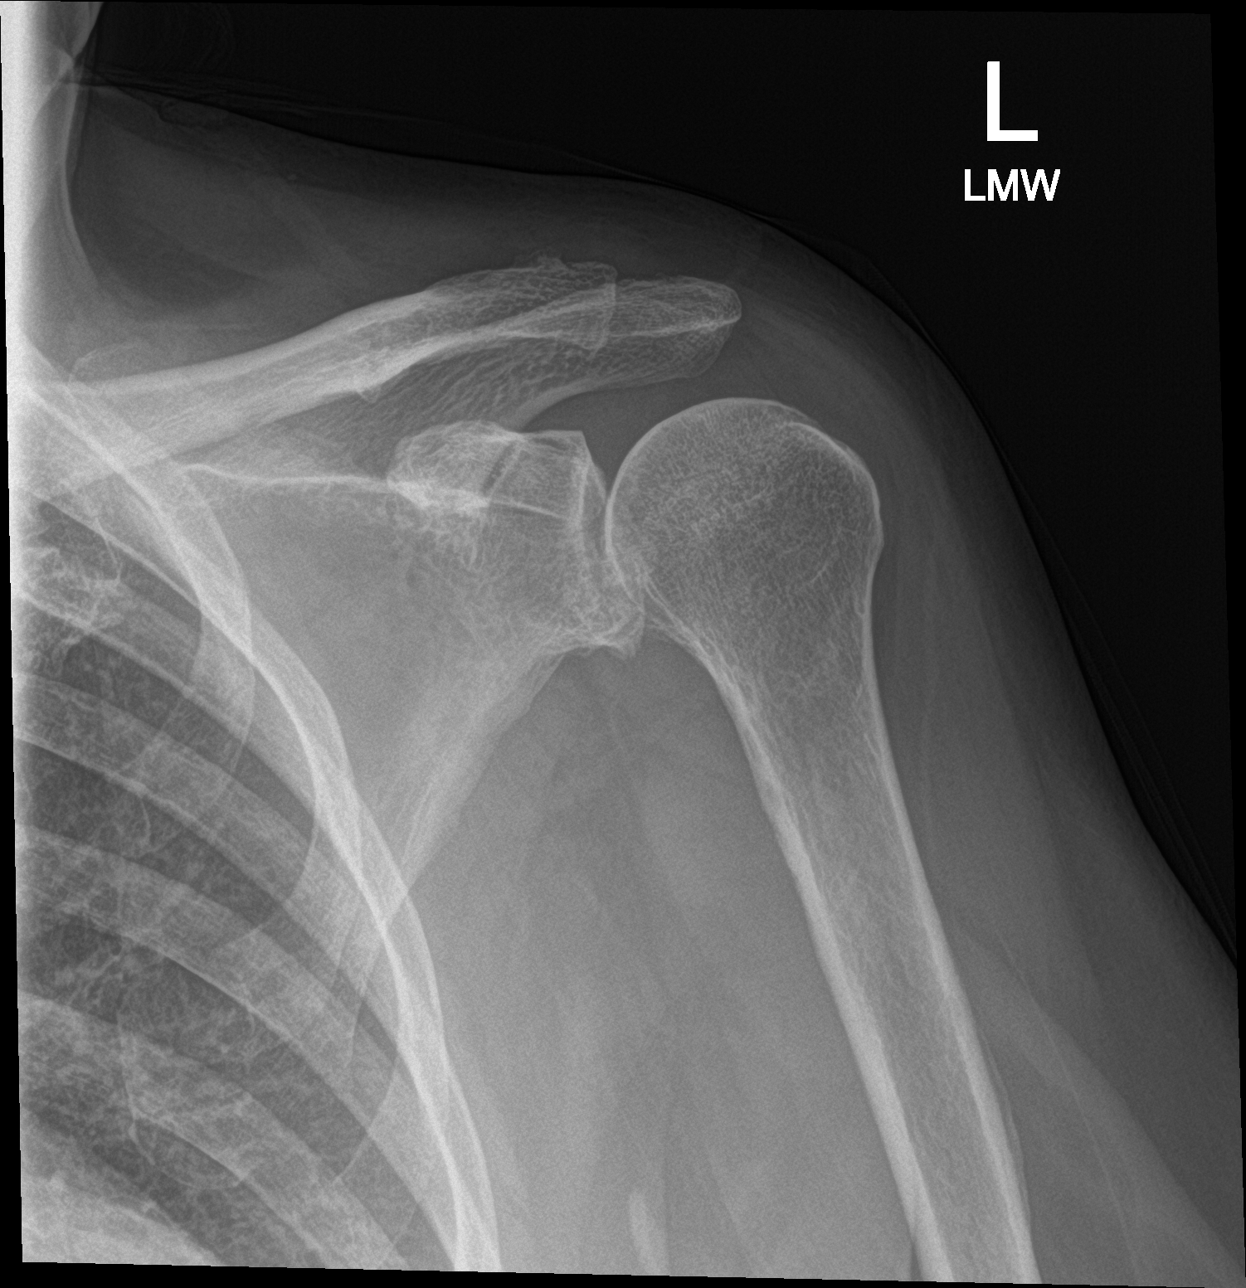

[shoulder y view]
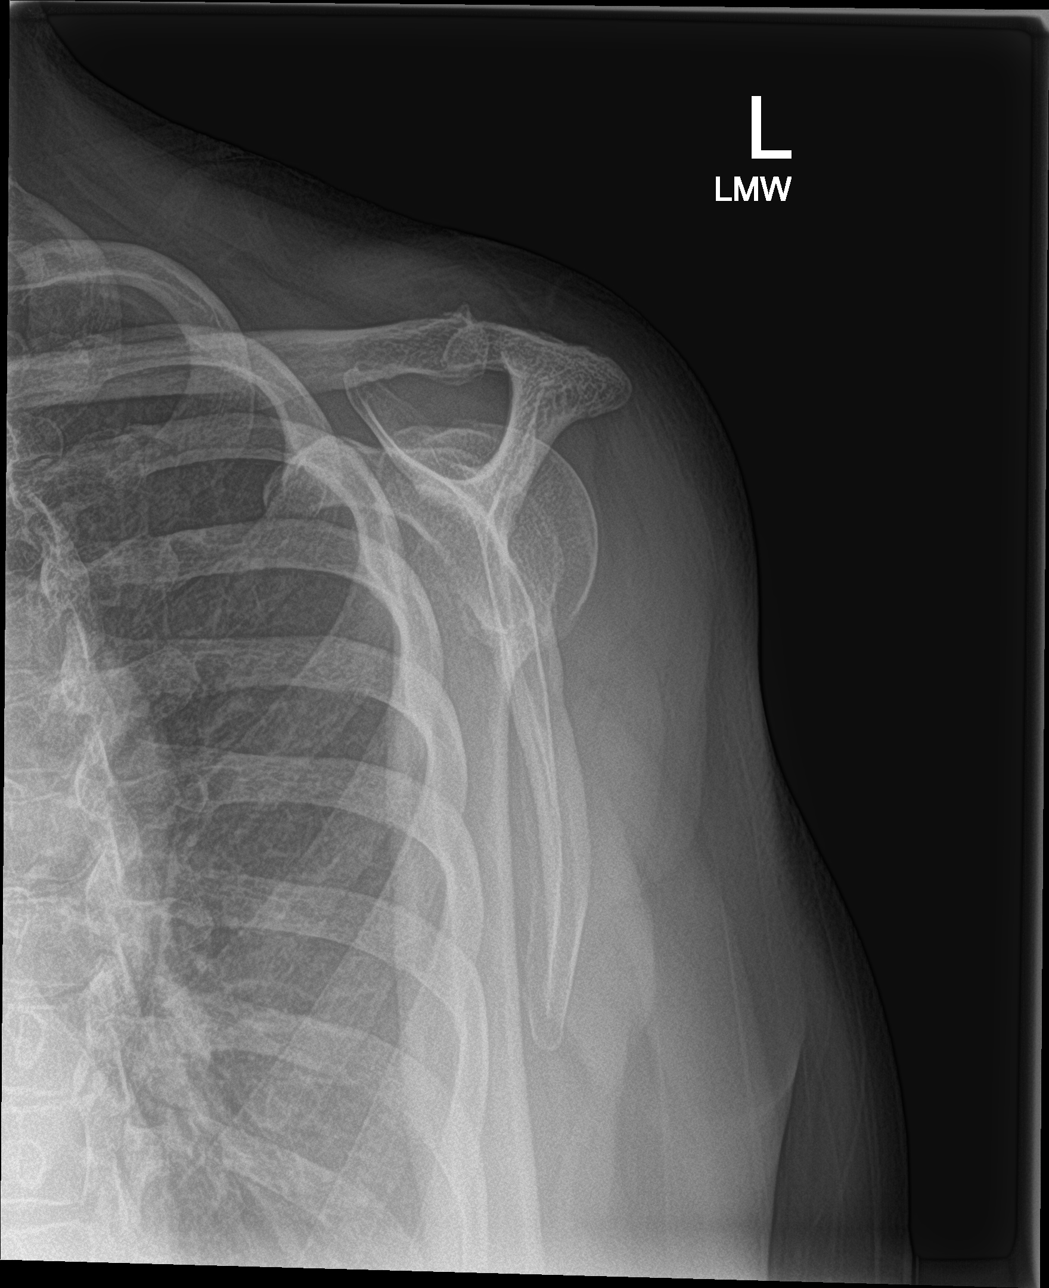

[shoulder ap neutral]
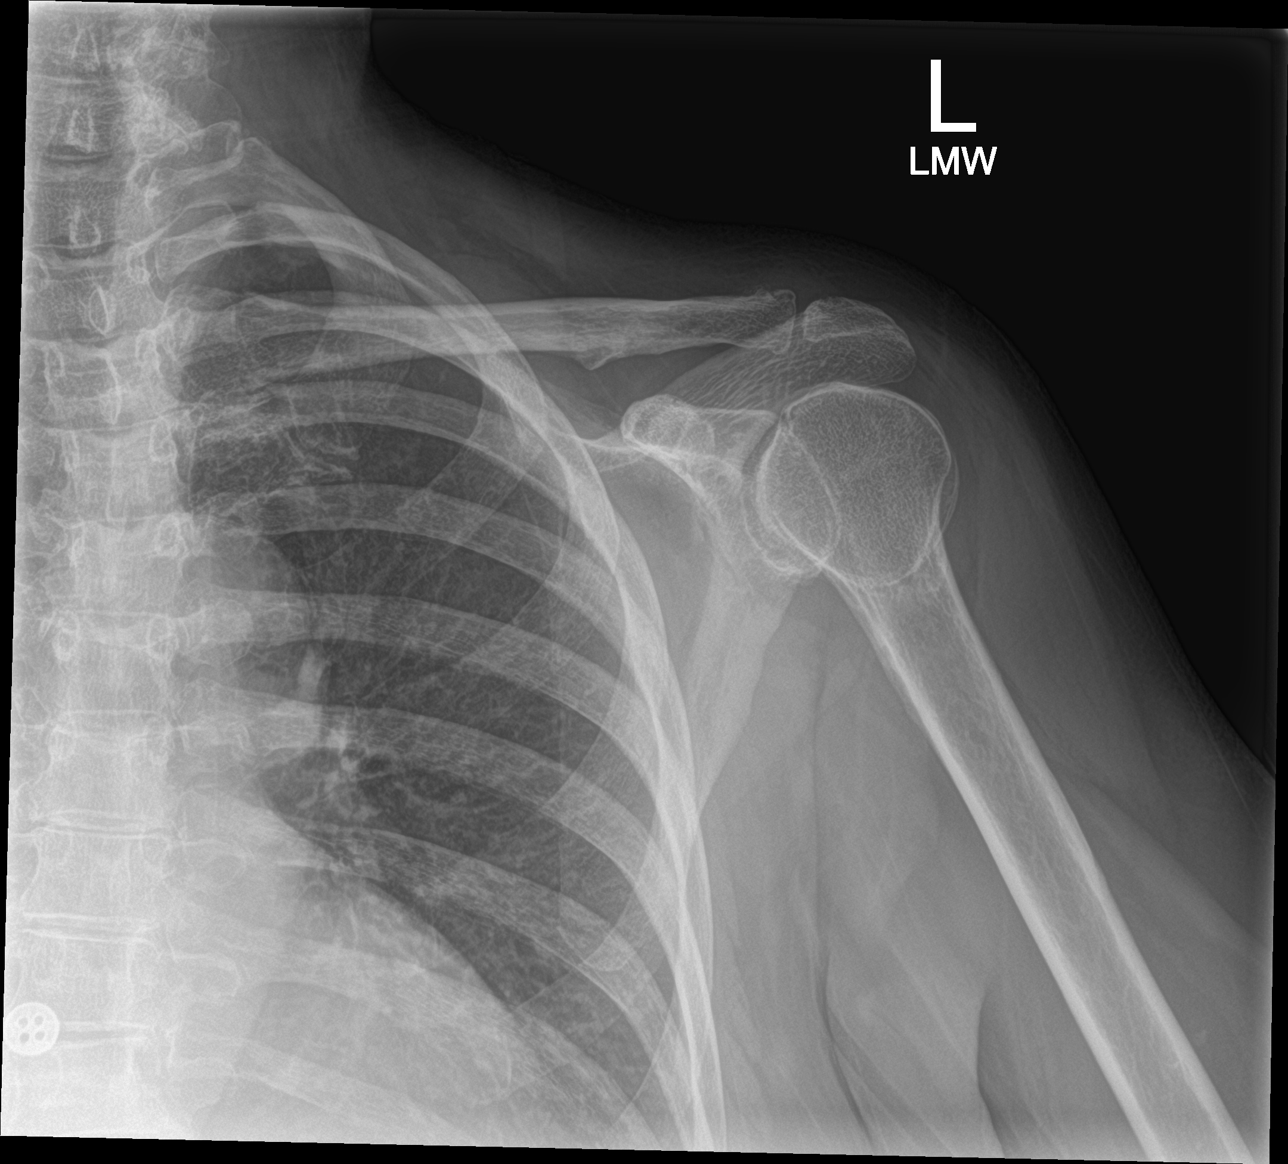

[3 of 3 positions shown; findings below may reference images not displayed]

FINDINGS: There is no evidence of fracture or dislocation. The left humeral
head is seated within the glenoid fossa. The acromioclavicular joint
is unremarkable in appearance. No significant soft tissue
abnormalities are seen. The visualized portions of the left lung are
clear.
IMPRESSION: No evidence of fracture or dislocation.

## 2019-06-23 ENCOUNTER — Other Ambulatory Visit: Payer: Self-pay

## 2019-06-23 ENCOUNTER — Other Ambulatory Visit: Payer: Self-pay | Admitting: Cardiovascular Disease

## 2019-06-23 MED ORDER — BISOPROLOL FUMARATE 5 MG PO TABS: 5.0000 mg | ORAL_TABLET | Freq: Two times a day (BID) | ORAL | 6 refills | Status: DC

## 2019-06-23 NOTE — Telephone Encounter (Signed)
*  STAT* If patient is at the pharmacy, call can be transferred to refill team.   1. Which medications need to be refilled? (please list name of each medication and dose if known)  Bisoprolol 5 mg po BID (recent change)  2. Which pharmacy/location (including street and city if local pharmacy) is medication to be sent to?  [facility] [place]  3. Do they need a 30 day or 90 day supply? 90

## 2019-06-25 NOTE — Progress Notes (Signed)
Cardiology Office Note    Date:  07/01/2019   ID:  Urijah, Raynor 01-Jan-1946, MRN [id number]  PCP:  Jaclyn Shaggy, MD  Cardiologist:  Julien Nordmann, MD  Electrophysiologist:  None   Chief Complaint: Follow up  History of Present Illness:   Donna Frazier is a 74 y.o. female with history of paroxysmal SVT, ongoing tobacco use, HLD, hypothyroidism, osteoarthritis, degenerative disc disease, peptic ulcer disease, and GERD who presents for follow-up of palpitations.  Patient was previously diagnosed with PACs and SVT on remote Holter monitoring and was managed on propanolol though had subsequently come off this as she had been doing quite well without symptoms of palpitations.  She was seen in 04/2018 noting increase in palpitations almost nightly and underwent 48-hour Holter monitoring at that time which demonstrated a predominant rhythm of normal sinus, rare PACs and PVCs representing less than 1% of total beats as well as a 10 beat run of atrial tachycardia.  Patient triggered events appeared to correlate with PVCs.  She was started on bisoprolol with improvement in symptoms.  She was seen by her primary cardiologist for routine follow-up on 04/20/2019 and denied any significant palpitations.  She was continuing to take bisoprolol.  There was noted increased stress at home as well as migraines.  No medication changes were made at that time.  She called our office on 06/07/2019 noting palpitations described as "pulsations and her neck" for the prior 1 to 2 weeks that were intermittent and lasted a few seconds or up to 30 minutes.  She did not feel like the bisoprolol was helping as much.    Her bisoprolol was titrated to 5 mg twice daily.  She was seen in follow-up on 06/10/2019 continuing to note intermittent palpitations that would typically start up around 7 PM and last until she fell asleep.  These palpitations felt similar to her prior episodes.  She indicated she had recently been started on a  caffeine-containing medication for her migraines.  3-day Zio patch was recommended, with preliminary review showing a dominant rhythm of sinus with average heart rate of 61 bpm, range 47 to 132 bpm, 2 runs of SVT with the fastest and longest interval lasting 16 beats.  A total of 6 patient triggered events were noted with 2 correlating with SVT.  The remaining 4 correlated to sinus rhythm with PVC/PAC.  She comes in doing very well today.  Since titrating bisoprolol to 5 mg twice daily she has not had any further palpitations.  No chest pain, shortness of breath, dizziness, presyncope, syncope.  No falls.  With her heart rate noted to be 47 bpm around 8 AM on her Zio patch, she indicates that she was sleeping at that time.  She typically goes to bed around 3:57 in the morning and wakes up around 11 AM.  She is very pleased with her improvement.  She does not have any other issues or concerns at this time.   Labs independently reviewed: 02/2019 - TSH normal, free T4 normal, BUN 24, SCr 1.19, K+ 4.7, albumin 4.3, AST/ALT normal, TC 151, TG 211, HDL 38, LDL 78, A1c 5.4   Past Medical History:  Diagnosis Date  . DDD (degenerative disc disease), cervical   . Hx of peptic ulcer   . Hyperlipidemia   . Hypothyroidism   . Migraine   . Osteoarthritis   . Palpitations   . PSVT (paroxysmal supraventricular tachycardia) (HCC)    a. Previous holter->PAC's and short runs of  SVT; b. 05/2018 48hr holter: Avg HR 55 (48-113). <1% pvcs and pacs.  . Tobacco abuse   . Trigger finger     Past Surgical History:  Procedure Laterality Date  . KNEE SURGERY      Current Medications: Current Meds  Medication Sig  . atorvastatin (LIPITOR) 10 MG tablet Take 10 mg by mouth daily.  . bisoprolol (ZEBETA) 5 MG tablet Take 1 tablet (5 mg total) by mouth 2 (two) times daily.  . butalbital-acetaminophen-caffeine (FIORICET) 50-325-40 MG tablet Take by mouth. Take 1-2 tablets every 8 hours as needed for headache.  .  Cyanocobalamin (B-12 PO) Take by mouth daily.  . famotidine (PEPCID) 20 MG tablet Take 20 mg by mouth 2 (two) times daily.  Marland Kitchen levothyroxine (SYNTHROID, LEVOTHROID) 75 MCG tablet Take one tablet on Mon. Tues. Wed. Thurs & Sun., 1/2 tablet Friday & Sat.  . Multiple Vitamin (MULTIVITAMIN) capsule Take 1 capsule by mouth daily.    Allergies:   Aspirin and Motrin [ibuprofen]   Social History   Socioeconomic History  . Marital status: Married    Spouse name: Not on file  . Number of children: Not on file  . Years of education: Not on file  . Highest education level: Not on file  Occupational History  . Not on file  Tobacco Use  . Smoking status: Current Every Day Smoker    Packs/day: 1.00    Years: 45.00    Pack years: 45.00    Types: Cigarettes  . Smokeless tobacco: Never Used  Substance and Sexual Activity  . Alcohol use: Not Currently    Alcohol/week: 1.0 standard drinks    Types: 1 Standard drinks or equivalent per week  . Drug use: No  . Sexual activity: Not on file  Other Topics Concern  . Not on file  Social History Narrative  . Not on file   Social Determinants of Health   Financial Resource Strain:   . Difficulty of Paying Living Expenses: Not on file  Food Insecurity:   . Worried About Charity fundraiser in the Last Year: Not on file  . Ran Out of Food in the Last Year: Not on file  Transportation Needs:   . Lack of Transportation (Medical): Not on file  . Lack of Transportation (Non-Medical): Not on file  Physical Activity:   . Days of Exercise per Week: Not on file  . Minutes of Exercise per Session: Not on file  Stress:   . Feeling of Stress : Not on file  Social Connections:   . Frequency of Communication with Friends and Family: Not on file  . Frequency of Social Gatherings with Friends and Family: Not on file  . Attends Religious Services: Not on file  . Active Member of Clubs or Organizations: Not on file  . Attends Archivist Meetings:  Not on file  . Marital Status: Not on file     Family History:  The patient's family history includes Heart attack (age of onset: 49) in her father. There is no history of Breast cancer.  ROS:   Review of Systems  Constitutional: Negative for chills, diaphoresis, fever, malaise/fatigue and weight loss.  HENT: Negative for congestion.   Eyes: Negative for discharge and redness.  Respiratory: Negative for cough, sputum production, shortness of breath and wheezing.   Cardiovascular: Negative for chest pain, palpitations, orthopnea, claudication, leg swelling and PND.  Gastrointestinal: Negative for abdominal pain, heartburn, nausea and vomiting.  Musculoskeletal: Negative for falls  and myalgias.  Skin: Negative for rash.  Neurological: Negative for dizziness, tingling, tremors, sensory change, speech change, focal weakness, loss of consciousness and weakness.  Endo/Heme/Allergies: Does not bruise/bleed easily.  Psychiatric/Behavioral: Negative for substance abuse. The patient is not nervous/anxious.   All other systems reviewed and are negative.    EKGs/Labs/Other Studies Reviewed:    Studies reviewed were summarized above. The additional studies were reviewed today:  48-hour Holter 05/2018: Normal sinus rhythm Rare PAC (#67) and PVC (#559), <1% of total beats, Short 10 beat run of atrial tachycardia Patient triggered events appear to correlate with PVCs __________  2D echo 06/29/2019: 1. Left ventricular ejection fraction, by visual estimation, is 55 to 60%. The left ventricle has normal function. There is no left ventricular hypertrophy.  2. Left ventricular diastolic parameters are consistent with Grade I diastolic dysfunction (impaired relaxation).  3. The left ventricle has no regional wall motion abnormalities.  4. Global right ventricle has normal systolic function.The right ventricular size is normal. No increase in right ventricular wall thickness.  5. Left atrial size  was normal.  6. Right atrial size was normal.  7. The mitral valve is normal in structure. Trivial mitral valve regurgitation. No evidence of mitral stenosis.  8. The tricuspid valve is not well visualized.  9. The tricuspid valve is not well visualized. Tricuspid valve regurgitation is trivial. 10. The aortic valve is tricuspid. Aortic valve regurgitation is not visualized. Mild aortic valve sclerosis without stenosis. 11. The pulmonic valve was grossly normal. Pulmonic valve regurgitation is not visualized. 12. Normal pulmonary artery systolic pressure. 13. The inferior vena cava is normal in size with greater than 50% respiratory variability, suggesting right atrial pressure of 3 mmHg.   EKG:  EKG is ordered today.  The EKG ordered today demonstrates NSR, 60 bpm, low voltage QRS, no acute ST-T changes, largely unchanged from prior studies  Recent Labs: No results found for requested labs within last 8760 hours.  Recent Lipid Panel No results found for: CHOL, TRIG, HDL, CHOLHDL, VLDL, LDLCALC, LDLDIRECT  PHYSICAL EXAM:    VS:  BP 122/70 (BP Location: Left Arm, Patient Position: Sitting, Cuff Size: Normal)   Pulse 60   Ht 5\' 1"  (1.549 m)   Wt 159 lb 4 oz (72.2 kg)   SpO2 96%   BMI 30.09 kg/m   BMI: Body mass index is 30.09 kg/m.  Physical Exam  Constitutional: She is oriented to person, place, and time. She appears well-developed and well-nourished.  HENT:  Head: Normocephalic and atraumatic.  Eyes: Right eye exhibits no discharge. Left eye exhibits no discharge.  Neck: No JVD present.  Cardiovascular: Normal rate, regular rhythm, S1 normal, S2 normal and normal heart sounds. Exam reveals no distant heart sounds, no friction rub, no midsystolic click and no opening snap.  No murmur heard. Pulses:      Posterior tibial pulses are 2+ on the right side and 2+ on the left side.  Pulmonary/Chest: Effort normal and breath sounds normal. No respiratory distress. She has no decreased  breath sounds. She has no wheezes. She has no rales. She exhibits no tenderness.  Abdominal: Soft. She exhibits no distension. There is no abdominal tenderness.  Musculoskeletal:        General: No edema.     Cervical back: Normal range of motion.  Neurological: She is alert and oriented to person, place, and time.  Skin: Skin is warm and dry. No cyanosis. Nails show no clubbing.  Psychiatric: She has a  normal mood and affect. Her speech is normal and behavior is normal. Judgment and thought content normal.    Wt Readings from Last 3 Encounters:  07/01/19 159 lb 4 oz (72.2 kg)  06/10/19 160 lb 12 oz (72.9 kg)  04/20/19 160 lb 8 oz (72.8 kg)     ASSESSMENT & PLAN:   1. Paroxysmal SVT/palpitations: Symptoms much improved following titration of bisoprolol to 5 mg twice daily.  Continue current medical therapy.  Recent labs unrevealing as outlined above.  Echo without any significant structural abnormalities.  Should she have significant worsening of atrial arrhythmia burden return we could consider referral to EP for SVT ablation.  2. Mitral regurgitation: Trivially regurgitant on echo as outlined above.  No murmur noted on exam.  Follow-up with echo in approximately 12 months.  3. HLD: LDL of 78 from 02/2019.  Remains on atorvastatin 10 mg daily.  Followed by PCP.  4. Tobacco use: Complete cessation is recommended.  Disposition: F/u with Dr. Mariah Milling or an APP in 6 months, sooner if needed.   Medication Adjustments/Labs and Tests Ordered: Current medicines are reviewed at length with the patient today.  Concerns regarding medicines are outlined above. Medication changes, Labs and Tests ordered today are summarized above and listed in the Patient Instructions accessible in Encounters.   Signed, Eula Listen, PA-C 07/01/2019 3:21 PM     [facility] [address] [place], [place] [address] [phone]

## 2019-06-28 MED ORDER — BISOPROLOL FUMARATE 5 MG PO TABS: 5.0000 mg | ORAL_TABLET | Freq: Two times a day (BID) | ORAL | 0 refills | Status: DC

## 2019-06-28 NOTE — Telephone Encounter (Signed)
Patient calling to check on status of refill Patient is completely out of medication and needs ASAP

## 2019-06-28 NOTE — Telephone Encounter (Signed)
Requested Prescriptions   Signed Prescriptions Disp Refills   bisoprolol (ZEBETA) 5 MG tablet 180 tablet 0    Sig: Take 1 tablet (5 mg total) by mouth 2 (two) times daily.    Authorizing Provider: Sondra Barges    Ordering User: Thayer Headings, BRANDY L

## 2019-06-29 ENCOUNTER — Other Ambulatory Visit: Payer: Self-pay

## 2019-06-29 ENCOUNTER — Telehealth: Payer: Self-pay | Admitting: Physician Assistant

## 2019-06-29 ENCOUNTER — Ambulatory Visit (INDEPENDENT_AMBULATORY_CARE_PROVIDER_SITE_OTHER): Payer: Medicare Other

## 2019-06-29 DIAGNOSIS — I358 Other nonrheumatic aortic valve disorders: Secondary | ICD-10-CM

## 2019-06-29 DIAGNOSIS — I493 Ventricular premature depolarization: Secondary | ICD-10-CM

## 2019-06-29 MED ORDER — BISOPROLOL FUMARATE 5 MG PO TABS: 5.0000 mg | ORAL_TABLET | Freq: Two times a day (BID) | ORAL | 0 refills | Status: DC

## 2019-06-29 NOTE — Telephone Encounter (Signed)
*  STAT* If patient is at the pharmacy, call can be transferred to refill team.   1. Which medications need to be refilled? (please list name of each medication and dose if known) Bisoprol  2. Which pharmacy/location (including street and city if local pharmacy) is medication to be sent to? [facility]  3. Do they need a 30 day or 90 day supply? 30

## 2019-06-29 NOTE — Telephone Encounter (Signed)
*  STAT* If patient is at the pharmacy, call can be transferred to refill team.   1. Which medications need to be refilled? (please list name of each medication and dose if known)  Bisoprolol   2. Which pharmacy/location (including street and city if local pharmacy) is medication to be sent to? [facility] [place]   3. Do they need a 30 day or 90 day supply? 90  rx sent today did not go through please resend and call if able per patient

## 2019-06-29 NOTE — Telephone Encounter (Signed)
Per Elease Hashimoto at [facility], the pharmacy has received the Rx for bisoprolol.

## 2019-07-01 ENCOUNTER — Other Ambulatory Visit: Payer: Self-pay

## 2019-07-01 ENCOUNTER — Encounter: Payer: Self-pay | Admitting: Physician Assistant

## 2019-07-01 ENCOUNTER — Ambulatory Visit (INDEPENDENT_AMBULATORY_CARE_PROVIDER_SITE_OTHER): Payer: Medicare Other | Admitting: Physician Assistant

## 2019-07-01 VITALS — BP 122/70 | HR 60 | Ht 61.0 in | Wt 159.2 lb

## 2019-07-01 DIAGNOSIS — R002 Palpitations: Secondary | ICD-10-CM

## 2019-07-01 DIAGNOSIS — I340 Nonrheumatic mitral (valve) insufficiency: Secondary | ICD-10-CM

## 2019-07-01 DIAGNOSIS — E782 Mixed hyperlipidemia: Secondary | ICD-10-CM

## 2019-07-01 DIAGNOSIS — F17200 Nicotine dependence, unspecified, uncomplicated: Secondary | ICD-10-CM

## 2019-07-01 DIAGNOSIS — I471 Supraventricular tachycardia: Secondary | ICD-10-CM

## 2019-07-01 MED ORDER — BISOPROLOL FUMARATE 5 MG PO TABS: 5.0000 mg | ORAL_TABLET | Freq: Two times a day (BID) | ORAL | 3 refills | Status: DC

## 2019-07-01 NOTE — Patient Instructions (Signed)
Medication Instructions:  No medication changes *If you need a refill on your cardiac medications before your next appointment, please call your pharmacy*  Lab Work: None ordered If you have labs (blood work) drawn today and your tests are completely normal, you will receive your results only by: Marland Kitchen MyChart Message (if you have MyChart) OR . A paper copy in the mail If you have any lab test that is abnormal or we need to change your treatment, we will call you to review the results.  Testing/Procedures: None ordered  Follow-Up: At [facility] you and your health needs are our priority.  As part of our continuing mission to provide you with exceptional heart care, we have created designated Provider Care Teams.  These Care Teams include your primary Cardiologist (physician) and Advanced Practice Providers (APPs -  Physician Assistants and Nurse Practitioners) who all work together to provide you with the care you need, when you need it.  Your next appointment:   12 month(s)  The format for your next appointment:   In Person  Provider:    You may see Julien Nordmann, MD or one of the following Advanced Practice Providers on your designated Care Team:    Nicolasa Ducking, NP  Eula Listen, PA-C  Marisue Ivan, PA-C   Other Instructions NA

## 2019-07-20 ENCOUNTER — Telehealth: Payer: Self-pay | Admitting: Cardiovascular Disease

## 2019-07-20 NOTE — Telephone Encounter (Signed)
I spoke with the patient. She states she developed chest pain between her breasts this morning upon getting up. The pain is non-radiating, but fairly constant. This will improve with positional changes.  The patient denies a history of CAD/ MI/ cardiac stenting.   The patient denies nausea/ vomiting/ diaphoresis. She just took 2 tylenol prior to calling the office.  I have advised the patient that since her symptoms can be relieved with some positional changes, I do not think what she is feeling is cardiac in nature at this time.   I have advised her to give the tylenol some time to work. She does not recall doing any strenuous activity to cause her symptoms. She is aware these may be related to the position in which she was sleeping.   She will continue to monitor. If symptoms worsen, she will give [place] a call back or call 911 if after hours.   The patient voices understanding and is agreeable.

## 2019-07-20 NOTE — Telephone Encounter (Signed)
Pt c/o of Chest Pain: STAT if CP now or developed within 24 hours  1. Are you having CP right now? Chest pain all day   2. Are you experiencing any other symptoms (ex. SOB, nausea, vomiting, sweating)? no  3. How long have you been experiencing CP? Since this morning   4. Is your CP continuous or coming and going? continuous   5. Have you taken Nitroglycerin? No  ?

## 2019-09-30 ENCOUNTER — Encounter: Payer: Self-pay | Admitting: Orthopaedic Surgery

## 2019-09-30 ENCOUNTER — Ambulatory Visit (INDEPENDENT_AMBULATORY_CARE_PROVIDER_SITE_OTHER): Payer: Medicare Other

## 2019-09-30 ENCOUNTER — Ambulatory Visit (INDEPENDENT_AMBULATORY_CARE_PROVIDER_SITE_OTHER): Payer: Medicare Other | Admitting: Orthopaedic Surgery

## 2019-09-30 ENCOUNTER — Other Ambulatory Visit: Payer: Self-pay

## 2019-09-30 DIAGNOSIS — M25562 Pain in left knee: Secondary | ICD-10-CM

## 2019-09-30 DIAGNOSIS — G8929 Other chronic pain: Secondary | ICD-10-CM | POA: Diagnosis not present

## 2019-09-30 MED ORDER — LIDOCAINE HCL 1 % IJ SOLN
3.0000 mL | INTRAMUSCULAR | Status: AC | PRN
  Administered 2019-09-30: 16:00:00 3 mL

## 2019-09-30 MED ORDER — METHYLPREDNISOLONE ACETATE 40 MG/ML IJ SUSP
40.0000 mg | INTRAMUSCULAR | Status: AC | PRN
  Administered 2019-09-30: 16:00:00 40 mg via INTRA_ARTICULAR

## 2019-09-30 MED ORDER — ACETAMINOPHEN-CODEINE #3 300-30 MG PO TABS: 1.0000 | ORAL_TABLET | Freq: Three times a day (TID) | ORAL | 0 refills | Status: DC | PRN

## 2019-09-30 NOTE — Progress Notes (Signed)
Office Visit Note   Patient: Donna Frazier           Date of Birth: 11-26-1945           MRN: [id number] Visit Date: 09/30/2019              Requested by: Albina Billet, MD [address]   [place],  [place] [address] PCP: Albina Billet, MD   Assessment & Plan: Visit Diagnoses:  1. Chronic pain of left knee     Plan: I was only able to aspirate about 20 cc of fluid off the knee. I did place a steroid injection in her left knee. We'll place her in a hinged knee brace today for stability purposes. I would like to see her back in just 2 weeks because if things are still significantly painful for her we may even consider an arthroscopic intervention. In light of her having an arthritic knee she is only been hurting bad for 3 days. All question concerns were answered and addressed. We will reevaluate her in 2 weeks. I will send in some Tylenol 3 as well for her pain.  Follow-Up Instructions: Return in about 2 weeks (around 10/14/2019).   Orders:  Orders Placed This Encounter  Procedures  . Large Joint Inj  . XR Knee 1-2 Views Left   No orders of the defined types were placed in this encounter.     Procedures: Large Joint Inj: L knee on 09/30/2019 4:09 PM Indications: diagnostic evaluation and pain Details: 22 G 1.5 in needle, superolateral approach  Arthrogram: No  Medications: 3 mL lidocaine 1 %; 40 mg methylPREDNISolone acetate 40 MG/ML Outcome: tolerated well, no immediate complications Procedure, treatment alternatives, risks and benefits explained, specific risks discussed. Consent was given by the patient. Immediately prior to procedure a time out was called to verify the correct patient, procedure, equipment, support staff and site/side marked as required. Patient was prepped and draped in the usual sterile fashion.       Clinical Data: No additional findings.   Subjective: Chief Complaint  Patient presents with  . Left Knee - Pain  The patient is a  74 year old female well-known to me. I actually performed an arthroscopic surgery on her left knee many years ago. She has had a flareup of left knee pain for about 3 days now. There is been no known injury. She states that she just woke up with this pain. She has not been able to bend her knee past 90 degrees without pain. She says the knee does feel swollen and full of fluid. She has a history of having had hyaluronic acid in that knee in the remote past as well to treat osteoarthritis of her knee. She has had no acute change in her medical status recently.  HPI  Review of Systems She currently denies any headache, chest pain, shortness of breath, fever, chills, nausea, vomiting she is walking with a limp.  Objective: Vital Signs: There were no vitals taken for this visit.  Physical Exam She is alert and orient x3 and in no acute distress but obvious discomfort. Ortho Exam Examination of her left knee does show a mild effusion. There is medial lateral joint line tenderness and pain with flexion and extension of her knee. There is no gross deformities. Her extensor mechanism intact but the knee is very painful on exam. Specialty Comments:  No specialty comments available.  Imaging: XR Knee 1-2 Views Left  Result Date: 09/30/2019 Two  views of the left knee show no acute findings. There is a mild effusion. There is tricompartmental arthritic changes with varus malalignment, medial joint space narrowing and periarticular osteophytes.    PMFS History: Patient Active Problem List   Diagnosis Date Noted  . Finger mass, left 04/10/2017  . Palpitations 07/24/2011  . SVT (supraventricular tachycardia) (HCC) 07/24/2011  . Smoking 07/24/2011  . Hypothyroidism 07/24/2011   Past Medical History:  Diagnosis Date  . DDD (degenerative disc disease), cervical   . Hx of peptic ulcer   . Hyperlipidemia   . Hypothyroidism   . Migraine   . Osteoarthritis   . Palpitations   . PSVT (paroxysmal  supraventricular tachycardia) (HCC)    a. Previous holter->PAC's and short runs of SVT; b. 05/2018 48hr holter: Avg HR 55 (48-113). <1% pvcs and pacs.  . Tobacco abuse   . Trigger finger     Family History  Problem Relation Age of Onset  . Heart attack Father 43  . Breast cancer Neg Hx     Past Surgical History:  Procedure Laterality Date  . KNEE SURGERY     Social History   Occupational History  . Not on file  Tobacco Use  . Smoking status: Current Every Day Smoker    Packs/day: 1.00    Years: 45.00    Pack years: 45.00    Types: Cigarettes  . Smokeless tobacco: Never Used  Substance and Sexual Activity  . Alcohol use: Not Currently    Alcohol/week: 1.0 standard drinks    Types: 1 Standard drinks or equivalent per week  . Drug use: No  . Sexual activity: Not on file

## 2019-10-14 ENCOUNTER — Ambulatory Visit: Payer: Medicare Other | Admitting: Orthopaedic Surgery

## 2019-10-19 ENCOUNTER — Ambulatory Visit: Payer: Medicare Other | Admitting: Cardiovascular Disease

## 2019-12-16 ENCOUNTER — Telehealth: Payer: Self-pay | Admitting: Cardiovascular Disease

## 2019-12-16 NOTE — Telephone Encounter (Signed)
Spoke with patient and reviewed her symptoms in detail. She reports this is not persistent and does not last long. Reviewed that she should monitor her blood pressures when she feels that sensation. Reviewed that she may have these sensations intermittently but if her symptoms should become persistent to then give [place] a call back for possible appointment with provider. She was reassured and had no further questions at this time.

## 2019-12-16 NOTE — Telephone Encounter (Signed)
Thank you for the update. She is due for follow up later this month. If she would like, go ahead and get her scheduled so we can see if her medication needs to be adjusted.

## 2019-12-16 NOTE — Telephone Encounter (Signed)
Patient staets she feels "pulsultating in her neck". She would like to discuss Bisoprolol.

## 2019-12-17 NOTE — Telephone Encounter (Signed)
Please call to schedule patient to come in at her earliest convenience with Donna Frazier or Dr Mariah Milling.

## 2020-01-13 ENCOUNTER — Other Ambulatory Visit: Payer: Self-pay | Admitting: Orthopaedic Surgery

## 2020-01-14 NOTE — Telephone Encounter (Signed)
Please advise 

## 2020-02-15 ENCOUNTER — Encounter: Payer: Self-pay | Admitting: Cardiovascular Disease

## 2020-02-15 ENCOUNTER — Other Ambulatory Visit: Payer: Self-pay

## 2020-02-15 ENCOUNTER — Ambulatory Visit (INDEPENDENT_AMBULATORY_CARE_PROVIDER_SITE_OTHER): Payer: Medicare Other | Admitting: Cardiovascular Disease

## 2020-02-15 VITALS — BP 138/80 | HR 56 | Ht 61.0 in | Wt 157.1 lb

## 2020-02-15 DIAGNOSIS — R002 Palpitations: Secondary | ICD-10-CM | POA: Diagnosis not present

## 2020-02-15 DIAGNOSIS — I471 Supraventricular tachycardia: Secondary | ICD-10-CM | POA: Diagnosis not present

## 2020-02-15 DIAGNOSIS — E782 Mixed hyperlipidemia: Secondary | ICD-10-CM | POA: Diagnosis not present

## 2020-02-15 DIAGNOSIS — I340 Nonrheumatic mitral (valve) insufficiency: Secondary | ICD-10-CM

## 2020-02-15 DIAGNOSIS — F17200 Nicotine dependence, unspecified, uncomplicated: Secondary | ICD-10-CM | POA: Diagnosis not present

## 2020-02-15 NOTE — Patient Instructions (Signed)
Medication Instructions:  No changes  If you need a refill on your cardiac medications before your next appointment, please call your pharmacy.    Lab work: No new labs needed   If you have labs (blood work) drawn today and your tests are completely normal, you will receive your results only by: . MyChart Message (if you have MyChart) OR . A paper copy in the mail If you have any lab test that is abnormal or we need to change your treatment, we will call you to review the results.   Testing/Procedures: No new testing needed   Follow-Up: At [facility], you and your health needs are our priority.  As part of our continuing mission to provide you with exceptional heart care, we have created designated Provider Care Teams.  These Care Teams include your primary Cardiologist (physician) and Advanced Practice Providers (APPs -  Physician Assistants and Nurse Practitioners) who all work together to provide you with the care you need, when you need it.  . You will need a follow up appointment in 12 months  . Providers on your designated Care Team:   . Christopher Berge, NP . Ryan Dunn, PA-C . Jacquelyn Visser, PA-C  Any Other Special Instructions Will Be Listed Below (If Applicable).  COVID-19 Vaccine Information can be found at: [email or web address] For questions related to vaccine distribution or appointments, please email [email or web address] or call [phone].     

## 2020-02-15 NOTE — Progress Notes (Signed)
Cardiology Office Note  Date:  02/15/2020   ID:  Donna Frazier, DOB [birth year], MRN [id number]  PCP:  Donna Shaggy, MD   Chief Complaint  Patient presents with  . OTHER    C/o palpitations side of neck. Meds reviewed verbally with pt.    HPI:  Donna Frazier is a 74 year-old woman with past medical history of palpitations hypothyroidism  SVT on holter Hx of GI ulcers smokes one pack per day for many years PVC on monitor Who presents for follow-up of her SVT and shortness of breath  In follow-up today she reports she has been having periodic palpitations Typically they present in the evening around 7 PM several times per week may be every other week Appreciates the palpitations in her neck Continues to take bisoprolol 5 twice daily 10 AM and 10 PM  Prior ZIO monitor showing symptomatic PVCs  Husband in his 90s Significant stress taking care of him He is unable to stand, she does all of his meals Spends some of her free time with a neighbor down the street who is also taking care of a spouse with disabilities  No regular exercise program  Continues to smoke Denies shortness of breath or chest pain  Lab work reviewed  Total cholesterol 151 LDL 78 hemoglobin A1c 5.4  EKG personally reviewed by myself on todays visit Shows sinus bradycardia rate 56 bpm no significant ST or T wave changes   Family hx Father with MI Mother with ETOH  PMH:   has a past medical history of DDD (degenerative disc disease), cervical, peptic ulcer, Hyperlipidemia, Hypothyroidism, Migraine, Osteoarthritis, Palpitations, PSVT (paroxysmal supraventricular tachycardia) (HCC), Tobacco abuse, and Trigger finger.  PSH:    Past Surgical History:  Procedure Laterality Date  . KNEE SURGERY      Current Outpatient Medications  Medication Sig Dispense Refill  . acetaminophen-codeine (TYLENOL #3) 300-30 MG tablet TAKE 1-2 TABLETS BY MOUTH EVERY 8 HOURS AS NEEDED FOR MODERATE PAIN 30 tablet 0  .  atorvastatin (LIPITOR) 10 MG tablet Take 10 mg by mouth daily.    . bisoprolol (ZEBETA) 5 MG tablet Take 1 tablet (5 mg total) by mouth 2 (two) times daily. 180 tablet 3  . butalbital-acetaminophen-caffeine (FIORICET) 50-325-40 MG tablet Take by mouth. Take 1-2 tablets every 8 hours as needed for headache.    . Cyanocobalamin (B-12 PO) Take by mouth daily.    . famotidine (PEPCID) 20 MG tablet Take 20 mg by mouth 2 (two) times daily.    Marland Kitchen levothyroxine (SYNTHROID, LEVOTHROID) 75 MCG tablet Take one tablet on Mon. Tues. Wed. Thurs & Sun., 1/2 tablet Friday & Sat.    . Multiple Vitamin (MULTIVITAMIN) capsule Take 1 capsule by mouth daily.     No current facility-administered medications for this visit.    Allergies:   Aspirin and Motrin [ibuprofen]   Social History:  The patient  reports that she has been smoking cigarettes. She has a 45.00 pack-year smoking history. She has never used smokeless tobacco. She reports previous alcohol use of about 1.0 standard drink of alcohol per week. She reports that she does not use drugs.   Family History:   family history includes Heart attack (age of onset: 55) in her father.   Review of Systems  Constitutional: Negative.   HENT: Negative.   Respiratory: Negative.   Cardiovascular: Positive for palpitations.  Gastrointestinal: Negative.   Musculoskeletal: Negative.   Neurological: Negative.   Psychiatric/Behavioral: Negative.   All other systems reviewed  and are negative.   PHYSICAL EXAM: VS:  BP 138/80 (BP Location: Left Arm, Patient Position: Sitting, Cuff Size: Normal)   Pulse (!) 56   Ht 5\' 1"  (1.549 m)   Wt 157 lb 2 oz (71.3 kg)   SpO2 98%   BMI 29.69 kg/m  , BMI Body mass index is 29.69 kg/m. Constitutional:  oriented to person, place, and time. No distress.  HENT:  Head: Grossly normal Eyes:  no discharge. No scleral icterus.  Neck: No JVD, no carotid bruits  Cardiovascular: Regular rate and rhythm, no murmurs  appreciated Pulmonary/Chest: Clear to auscultation bilaterally, no wheezes or rails Abdominal: Soft.  no distension.  no tenderness.  Musculoskeletal: Normal range of motion Neurological:  normal muscle tone. Coordination normal. No atrophy Skin: Skin warm and dry Psychiatric: normal affect, pleasant   Recent Labs: No results found for requested labs within last 8760 hours.    Lipid Panel No results found for: CHOL, HDL, LDLCALC, TRIG    Wt Readings from Last 3 Encounters:  02/15/20 157 lb 2 oz (71.3 kg)  07/01/19 159 lb 4 oz (72.2 kg)  06/10/19 160 lb 12 oz (72.9 kg)     ASSESSMENT AND PLAN:  PVCs Prior ZIO monitor was having symptomatic PVCs Likely still having symptoms, Recommend she take her bisoprolol with dinner not 10 PM as most of her symptoms are in the evening No further work-up needed, likely brought on by some home stress   SVT (supraventricular tachycardia) (HCC) - Plan: EKG 12-Lead Better on bisoprolol No further work-up  Smoking No desire to quit We have encouraged her to continue to work on weaning her cigarettes and smoking cessation. She will continue to work on this and does not want any assistance with chantix.   Hypothyroidism, unspecified type Manage her primary care  Adjustment disorder Significant stress taking care of her husband who is in his 90s, unable to stand    Total encounter time more than 25 minutes  Greater than 50% was spent in counseling and coordination of care with the patient    No orders of the defined types were placed in this encounter.    Signed, 08/08/19, M.D., Ph.D. 02/15/2020  [facility] [place], [place]

## 2020-03-06 ENCOUNTER — Ambulatory Visit: Payer: Medicare Other | Admitting: Cardiovascular Disease

## 2020-03-13 ENCOUNTER — Encounter: Payer: Self-pay | Admitting: Cardiovascular Disease

## 2020-04-13 ENCOUNTER — Other Ambulatory Visit: Payer: Self-pay | Admitting: Orthopaedic Surgery

## 2020-06-07 ENCOUNTER — Ambulatory Visit: Payer: Medicare Other | Admitting: Orthopaedic Surgery

## 2020-07-02 ENCOUNTER — Other Ambulatory Visit: Payer: Self-pay | Admitting: Physician Assistant

## 2020-07-03 NOTE — Telephone Encounter (Signed)
Rx request sent to pharmacy.  

## 2020-11-06 ENCOUNTER — Other Ambulatory Visit: Payer: Self-pay | Admitting: Internal Medicine

## 2020-11-13 ENCOUNTER — Other Ambulatory Visit: Payer: Self-pay | Admitting: Internal Medicine

## 2020-11-13 DIAGNOSIS — N6320 Unspecified lump in the left breast, unspecified quadrant: Secondary | ICD-10-CM

## 2020-11-13 DIAGNOSIS — N63 Unspecified lump in unspecified breast: Secondary | ICD-10-CM

## 2020-11-13 DIAGNOSIS — N6322 Unspecified lump in the left breast, upper inner quadrant: Secondary | ICD-10-CM

## 2020-11-16 ENCOUNTER — Inpatient Hospital Stay: Admission: RE | Admit: 2020-11-16 | Payer: Medicare Other | Source: Ambulatory Visit

## 2020-11-16 ENCOUNTER — Other Ambulatory Visit: Payer: Medicare Other

## 2020-11-21 ENCOUNTER — Ambulatory Visit
Admission: RE | Admit: 2020-11-21 | Discharge: 2020-11-21 | Disposition: A | Discharge status: Home / Self Care | Payer: Medicare Other | Source: Ambulatory Visit | Attending: Internal Medicine | Admitting: Internal Medicine

## 2020-11-21 ENCOUNTER — Other Ambulatory Visit: Payer: Self-pay

## 2020-11-21 DIAGNOSIS — N6322 Unspecified lump in the left breast, upper inner quadrant: Secondary | ICD-10-CM | POA: Diagnosis present

## 2020-11-21 DIAGNOSIS — N63 Unspecified lump in unspecified breast: Secondary | ICD-10-CM | POA: Diagnosis present

## 2020-12-01 IMAGING — MG DIGITAL SCREENING BILAT W/ TOMO
8 series · 8 of 24 positions shown · non-contrast
Comparison: Previous exam(s).

CLINICAL DATA: Screening.

EXAM:
DIGITAL SCREENING BILATERAL MAMMOGRAM WITH TOMO AND CAD

[L CC synth-2D]
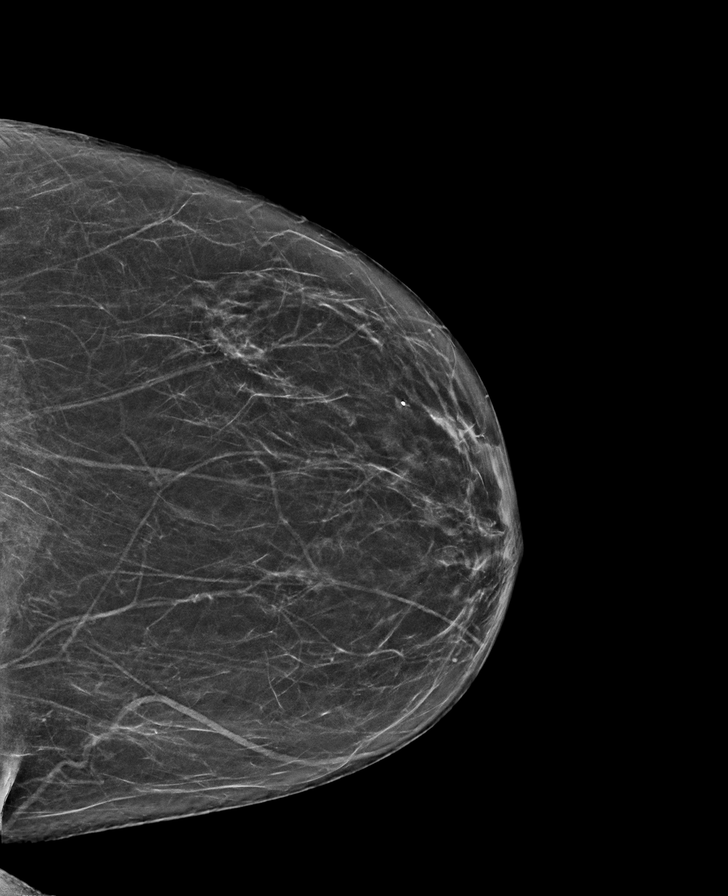

[R CC synth-2D]
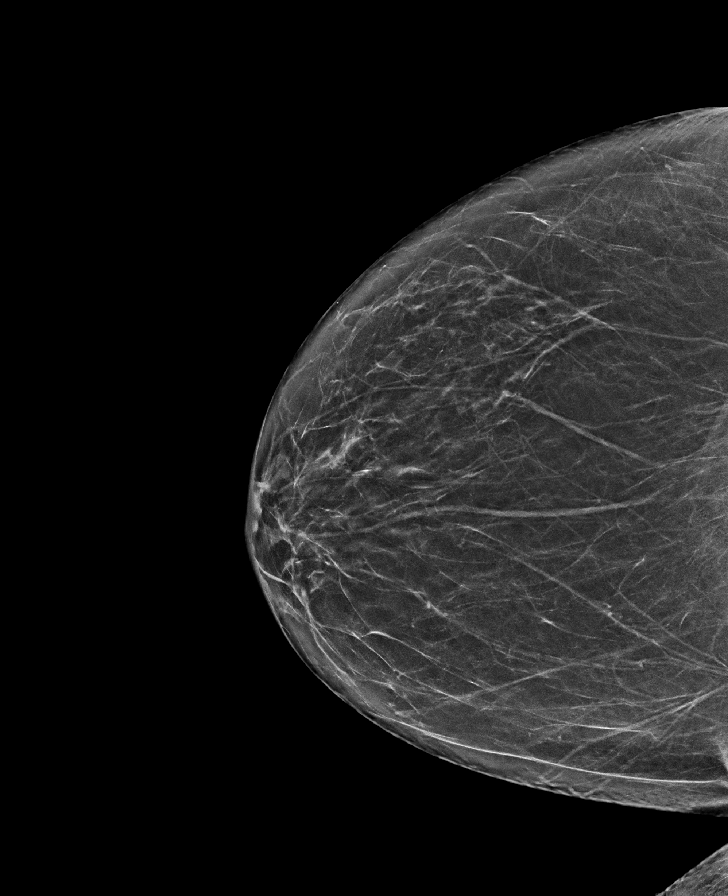

[L MLO synth-2D]
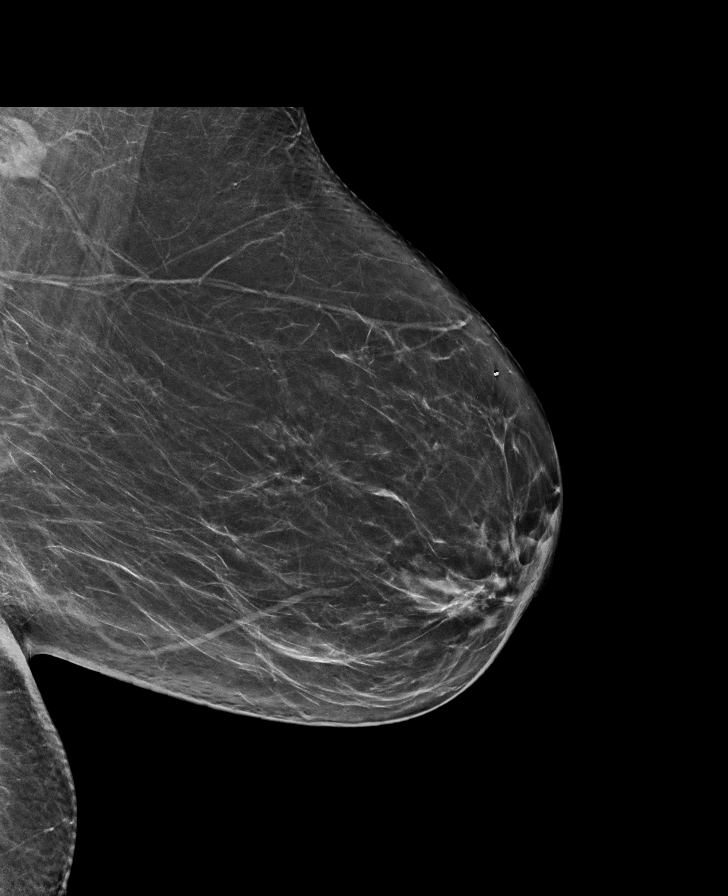

[R MLO synth-2D]
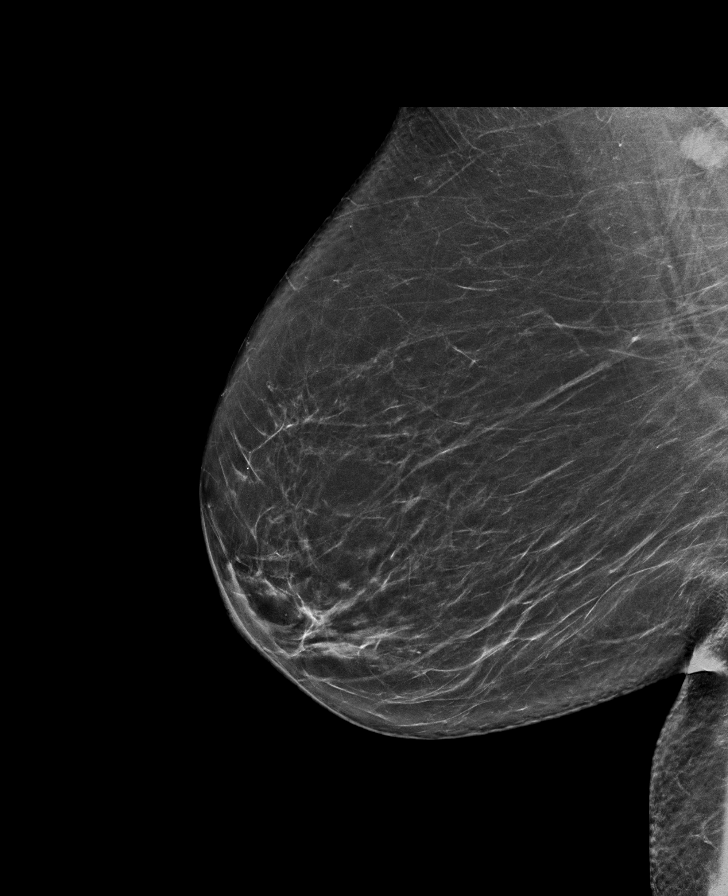

[R CC tomo · tomo slice 33/65.0]
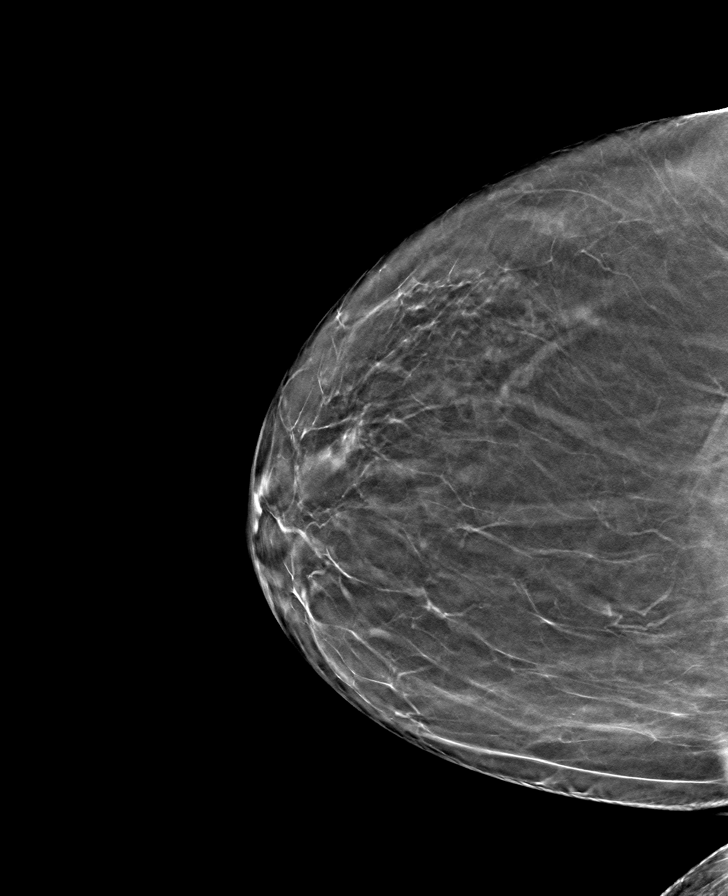

[L CC tomo · tomo slice 30/59.0]
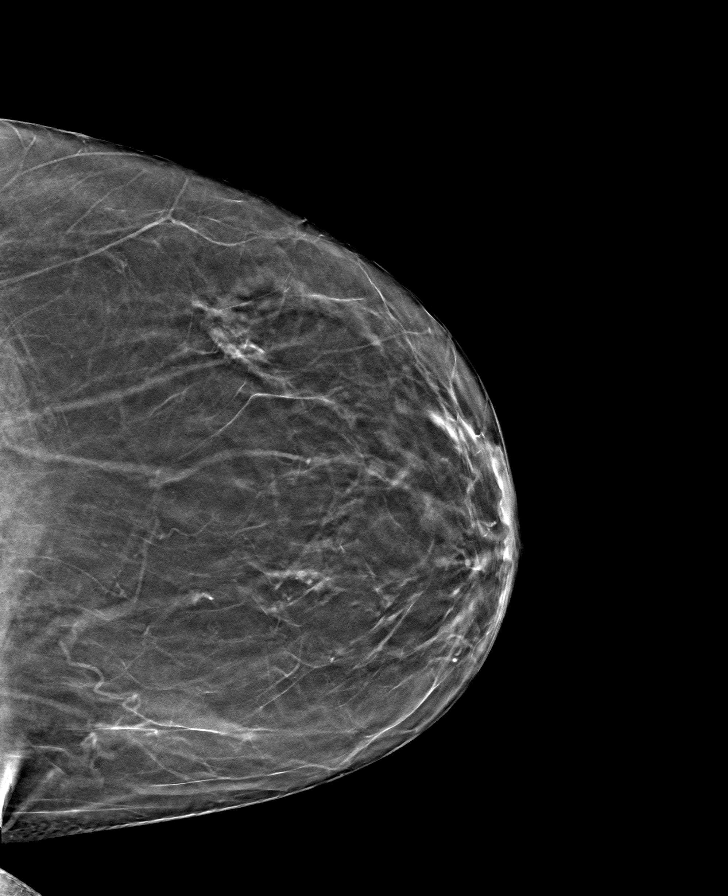

[L MLO tomo · tomo slice 37/73.0]
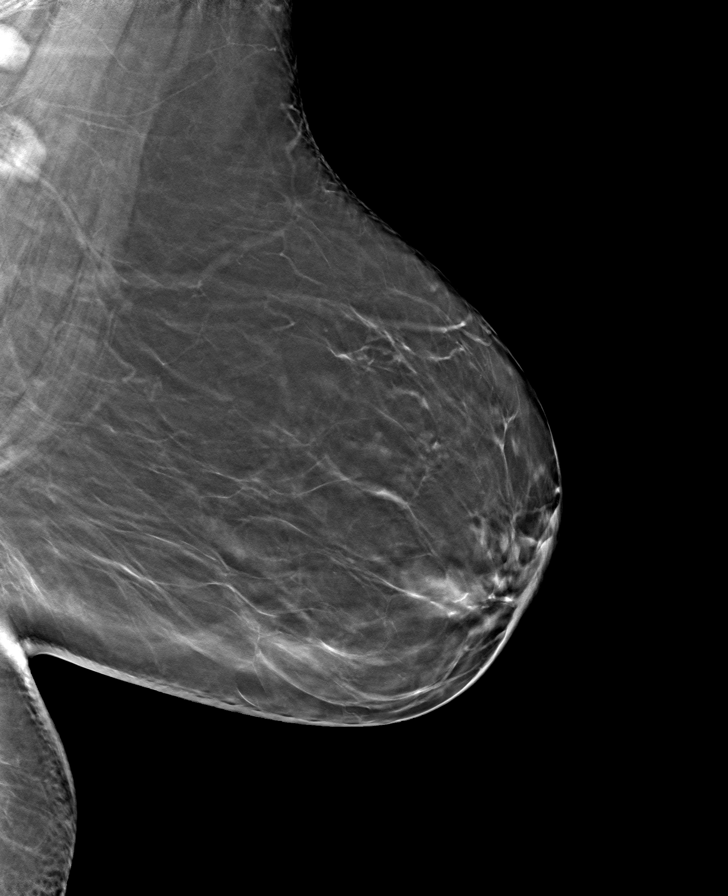

[R MLO tomo · tomo slice 37/74.0]
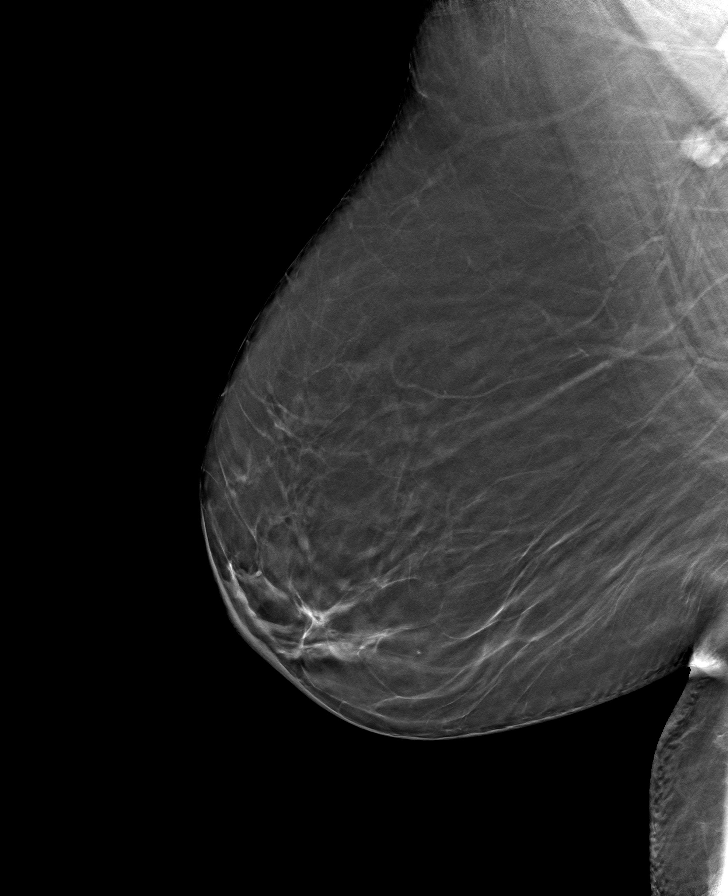

[8 of 24 positions shown; findings below may reference images not displayed]

ACR Breast Density Category b: There are scattered areas of
fibroglandular density.
FINDINGS: There are no findings suspicious for malignancy. Images were
processed with CAD.
IMPRESSION: No mammographic evidence of malignancy. A result letter of this
screening mammogram will be mailed directly to the patient.

RECOMMENDATION:
Screening mammogram in one year. (Code:[id number])

BI-RADS CATEGORY  1: Negative.

## 2021-01-17 ENCOUNTER — Other Ambulatory Visit: Payer: Self-pay

## 2021-01-17 ENCOUNTER — Ambulatory Visit (INDEPENDENT_AMBULATORY_CARE_PROVIDER_SITE_OTHER): Payer: Medicare Other

## 2021-01-17 ENCOUNTER — Ambulatory Visit (INDEPENDENT_AMBULATORY_CARE_PROVIDER_SITE_OTHER): Payer: Medicare Other | Admitting: Orthopaedic Surgery

## 2021-01-17 ENCOUNTER — Encounter: Payer: Self-pay | Admitting: Orthopaedic Surgery

## 2021-01-17 DIAGNOSIS — M25561 Pain in right knee: Secondary | ICD-10-CM

## 2021-01-17 MED ORDER — METHYLPREDNISOLONE ACETATE 40 MG/ML IJ SUSP
40.0000 mg | INTRAMUSCULAR | Status: AC | PRN
  Administered 2021-01-17: 40 mg via INTRA_ARTICULAR

## 2021-01-17 MED ORDER — LIDOCAINE HCL 1 % IJ SOLN
3.0000 mL | INTRAMUSCULAR | Status: AC | PRN
  Administered 2021-01-17: 3 mL

## 2021-01-17 MED ORDER — ACETAMINOPHEN-CODEINE #3 300-30 MG PO TABS: 1.0000 | ORAL_TABLET | Freq: Three times a day (TID) | ORAL | 0 refills | Status: AC | PRN

## 2021-01-17 NOTE — Progress Notes (Signed)
Office Visit Note   Patient: Donna Frazier           Date of Birth: 03-Sep-1945           MRN: [id number] Visit Date: 01/17/2021              Requested by: Jaclyn Shaggy, MD [address]   [place],  [place] [address] PCP: Jaclyn Shaggy, MD   Assessment & Plan: Visit Diagnoses:  1. Acute pain of right knee     Plan: Based on her exam she certainly could have a meniscal tear.  I did provide a steroid injection in the knee to see if this will help temporize her symptoms with the right knee.  We will try a hinged knee brace and I recommended cane or opposite hand.  I will send in some Tylenol 3 to her pharmacy.  I would like to see her back in 2 weeks for repeat exam of the right knee.  All questions and concerns were answered and addressed.  Follow-Up Instructions: Return in about 2 weeks (around 01/31/2021).   Orders:  Orders Placed This Encounter  Procedures   Large Joint Inj   XR Knee 1-2 Views Right   No orders of the defined types were placed in this encounter.     Procedures: Large Joint Inj: R knee on 01/17/2021 8:59 AM Indications: diagnostic evaluation and pain Details: 22 G 1.5 in needle, superolateral approach  Arthrogram: No  Medications: 3 mL lidocaine 1 %; 40 mg methylPREDNISolone acetate 40 MG/ML Outcome: tolerated well, no immediate complications Procedure, treatment alternatives, risks and benefits explained, specific risks discussed. Consent was given by the patient. Immediately prior to procedure a time out was called to verify the correct patient, procedure, equipment, support staff and site/side marked as required. Patient was prepped and draped in the usual sterile fashion.      Clinical Data: No additional findings.   Subjective: Chief Complaint  Patient presents with   Right Knee - Pain  The patient 75 year old well-known to me.  She has had pain on her right knee since Monday when falling on that knee and twisting it.  She has swelling and  pain behind her knee she states.  She reports decreased range of motion and pain with weightbearing and flexion of that right knee.  She has known arthritis in the right knee but has not had any issues at all recently.  She said no acute change in her medical status.  Unfortunately her husband who is a long-term patient of mine died earlier this month.  HPI  Review of Systems There is currently listed no headache, chest pain, shortness of breath, fever, chills, nausea, vomiting  Objective: Vital Signs: There were no vitals taken for this visit.  Physical Exam She is alert and orient x3 and in no acute distress Ortho Exam Examination of her right knee shows no effusion but it is painful throughout arc of motion she has a lot of guarding with range of motion of the knee.  There is no redness.  Her extensor mechanism is intact. Specialty Comments:  No specialty comments available.  Imaging: XR Knee 1-2 Views Right  Result Date: 01/17/2021 2 views of the right knee show no acute findings.  There is arthritic changes in all 3 compartments.    PMFS History: Patient Active Problem List   Diagnosis Date Noted   Finger mass, left 04/10/2017   Palpitations 07/24/2011   SVT (supraventricular tachycardia) (  HCC) 07/24/2011   Smoking 07/24/2011   Hypothyroidism 07/24/2011   Past Medical History:  Diagnosis Date   DDD (degenerative disc disease), cervical    Hx of peptic ulcer    Hyperlipidemia    Hypothyroidism    Migraine    Osteoarthritis    Palpitations    PSVT (paroxysmal supraventricular tachycardia) (HCC)    a. Previous holter->PAC's and short runs of SVT; b. 05/2018 48hr holter: Avg HR 55 (48-113). <1% pvcs and pacs.   Tobacco abuse    Trigger finger     Family History  Problem Relation Age of Onset   Heart attack Father 33   Breast cancer Neg Hx     Past Surgical History:  Procedure Laterality Date   KNEE SURGERY     Social History   Occupational History   Not on  file  Tobacco Use   Smoking status: Every Day    Packs/day: 1.00    Years: 45.00    Pack years: 45.00    Types: Cigarettes   Smokeless tobacco: Never  Vaping Use   Vaping Use: Never used  Substance and Sexual Activity   Alcohol use: Not Currently    Alcohol/week: 1.0 standard drink    Types: 1 Standard drinks or equivalent per week   Drug use: No   Sexual activity: Not on file

## 2021-01-31 ENCOUNTER — Ambulatory Visit: Payer: Medicare Other | Admitting: Orthopaedic Surgery

## 2021-02-06 ENCOUNTER — Other Ambulatory Visit: Payer: Self-pay

## 2021-02-06 MED ORDER — BISOPROLOL FUMARATE 5 MG PO TABS: 5.0000 mg | ORAL_TABLET | Freq: Two times a day (BID) | ORAL | 6 refills | Status: DC

## 2021-02-18 NOTE — Progress Notes (Signed)
Cardiology Office Note  Date:  02/19/2021   ID:  Donna Frazier, DOB 10-11-1945, MRN [id number]  PCP:  Jaclyn Shaggy, MD   Chief Complaint  Patient presents with   12 month follow up     Patient c/o dizziness and lightheaded for the past two days. Medications reviewed by the patient verbally.     HPI:  Donna Frazier is a 75 year-old woman with past medical history of palpitations hypothyroidism  SVT on holter Hx of GI ulcers smokes one pack per day for many years PVC on monitor Who presents for follow-up of her SVT and shortness of breath, dizziness  Long history of taking care of her husband for many years Husband died , age 50 Died in nursing home, dementia, legs "stopped working" Not eating  Past three days, dizzy/lightheaded Possible vertigo, Lots of nausea  No regular exercise program Smoker No chest pain or shortness of breath on exertion  Otherwise she has been doing well Tachypalpitations better on bisoprolol twice daily Less palpitations in her neck Tolerating other medications  Prior ZIO monitor showing symptomatic PVCs  Lab work reviewed  Total cholesterol 151 LDL 78 hemoglobin A1c 5.4  EKG personally reviewed by myself on todays visit Shows sinus bradycardia rate 55 bpm no significant ST or T wave changes  Family hx Father with MI Mother with ETOH   PMH:   has a past medical history of DDD (degenerative disc disease), cervical, peptic ulcer, Hyperlipidemia, Hypothyroidism, Migraine, Osteoarthritis, Palpitations, PSVT (paroxysmal supraventricular tachycardia) (HCC), Tobacco abuse, and Trigger finger.  PSH:    Past Surgical History:  Procedure Laterality Date   KNEE SURGERY      Current Outpatient Medications  Medication Sig Dispense Refill   acetaminophen-codeine (TYLENOL #3) 300-30 MG tablet Take 1-2 tablets by mouth every 8 (eight) hours as needed. 30 tablet 0   atorvastatin (LIPITOR) 10 MG tablet Take 10 mg by mouth daily.     bisoprolol  (ZEBETA) 5 MG tablet Take 1 tablet (5 mg total) by mouth 2 (two) times daily. 60 tablet 6   butalbital-acetaminophen-caffeine (FIORICET) 50-325-40 MG tablet Take by mouth. Take 1-2 tablets every 8 hours as needed for headache.     Cyanocobalamin (B-12 PO) Take by mouth daily.     famotidine (PEPCID) 20 MG tablet Take 20 mg by mouth 2 (two) times daily.     levothyroxine (SYNTHROID, LEVOTHROID) 75 MCG tablet Take one tablet on Mon. Tues. Wed. Thurs & Sun., 1/2 tablet Friday & Sat.     Multiple Vitamin (MULTIVITAMIN) capsule Take 1 capsule by mouth daily.     No current facility-administered medications for this visit.    Allergies:   Aspirin and Motrin [ibuprofen]   Social History:  The patient  reports that she has been smoking cigarettes. She has a 45.00 pack-year smoking history. She has never used smokeless tobacco. She reports that she does not currently use alcohol after a past usage of about 1.0 standard drink per week. She reports that she does not use drugs.   Family History:   family history includes Heart attack (age of onset: 44) in her father.   Review of Systems  Constitutional: Negative.   HENT: Negative.    Respiratory: Negative.    Cardiovascular:  Positive for palpitations.  Gastrointestinal: Negative.   Musculoskeletal: Negative.   Neurological: Negative.   Psychiatric/Behavioral: Negative.    All other systems reviewed and are negative.  PHYSICAL EXAM: VS:  BP 134/72 (BP Location: Left Arm, Patient  Position: Sitting, Cuff Size: Normal)   Pulse (!) 55   Ht 5\' 1"  (1.549 m)   Wt 164 lb (74.4 kg)   SpO2 98%   BMI 30.99 kg/m  , BMI Body mass index is 30.99 kg/m. Constitutional:  oriented to person, place, and time. No distress.  HENT:  Head: Grossly normal Eyes:  no discharge. No scleral icterus.  Neck: No JVD, no carotid bruits  Cardiovascular: Regular rate and rhythm, no murmurs appreciated Pulmonary/Chest: Clear to auscultation bilaterally, no wheezes or  rails Abdominal: Soft.  no distension.  no tenderness.  Musculoskeletal: Normal range of motion Neurological:  normal muscle tone. Coordination normal. No atrophy Skin: Skin warm and dry Psychiatric: normal affect, pleasant   Recent Labs: No results found for requested labs within last 8760 hours.    Lipid Panel No results found for: CHOL, HDL, LDLCALC, TRIG    Wt Readings from Last 3 Encounters:  02/19/21 164 lb (74.4 kg)  02/15/20 157 lb 2 oz (71.3 kg)  07/01/19 159 lb 4 oz (72.2 kg)     ASSESSMENT AND PLAN:  PVCs Prior ZIO monitor was having symptomatic PVCs Better on bisoprolol 5 twice daily  SVT (supraventricular tachycardia) (HCC) - Plan: EKG 12-Lead Better on bisoprolol 5 twice daily No further work-up  Smoking Smoking cessation recommended  Hypothyroidism, unspecified type Manage her primary care  Adjustment disorder Difficulty adjusting after loss of her husband who is 66 years old  Vertigo Prescription provided for meclizine    Total encounter time more than 25 minutes  Greater than 50% was spent in counseling and coordination of care with the patient    No orders of the defined types were placed in this encounter.    Signed, 99, M.D., Ph.D. 02/19/2021  [facility] [place], [place]

## 2021-02-19 ENCOUNTER — Encounter: Payer: Self-pay | Admitting: Cardiovascular Disease

## 2021-02-19 ENCOUNTER — Ambulatory Visit (INDEPENDENT_AMBULATORY_CARE_PROVIDER_SITE_OTHER): Payer: Medicare Other | Admitting: Cardiovascular Disease

## 2021-02-19 ENCOUNTER — Other Ambulatory Visit: Payer: Self-pay

## 2021-02-19 VITALS — BP 134/72 | HR 55 | Ht 61.0 in | Wt 164.0 lb

## 2021-02-19 DIAGNOSIS — R002 Palpitations: Secondary | ICD-10-CM

## 2021-02-19 DIAGNOSIS — F17200 Nicotine dependence, unspecified, uncomplicated: Secondary | ICD-10-CM

## 2021-02-19 DIAGNOSIS — E782 Mixed hyperlipidemia: Secondary | ICD-10-CM | POA: Diagnosis not present

## 2021-02-19 DIAGNOSIS — I471 Supraventricular tachycardia: Secondary | ICD-10-CM

## 2021-02-19 DIAGNOSIS — I340 Nonrheumatic mitral (valve) insufficiency: Secondary | ICD-10-CM

## 2021-02-19 MED ORDER — ATORVASTATIN CALCIUM 10 MG PO TABS: 10.0000 mg | ORAL_TABLET | Freq: Every day | ORAL | 3 refills | Status: DC

## 2021-02-19 MED ORDER — MECLIZINE HCL 25 MG PO TABS: 25.0000 mg | ORAL_TABLET | Freq: Three times a day (TID) | ORAL | 1 refills | Status: AC | PRN

## 2021-02-19 MED ORDER — BISOPROLOL FUMARATE 5 MG PO TABS: 5.0000 mg | ORAL_TABLET | Freq: Two times a day (BID) | ORAL | 11 refills | Status: DC

## 2021-02-19 NOTE — Patient Instructions (Addendum)
Medication Instructions:  Please START meclizine 25 mg  up to three times a day as needed for dizziness  If you need a refill on your cardiac medications before your next appointment, please call your pharmacy.   Lab work: No new labs needed  Testing/Procedures: No new testing needed  Follow-Up: At [facility], you and your health needs are our priority.  As part of our continuing mission to provide you with exceptional heart care, we have created designated Provider Care Teams.  These Care Teams include your primary Cardiologist (physician) and Advanced Practice Providers (APPs -  Physician Assistants and Nurse Practitioners) who all work together to provide you with the care you need, when you need it.  You will need a follow up appointment in 12 months  Providers on your designated Care Team:   Nicolasa Ducking, NP Eula Listen, PA-C Marisue Ivan, PA-C Cadence Alger, [place]  COVID-19 Vaccine Information can be found at: [email or web address] For questions related to vaccine distribution or appointments, please email [email or web address] or call [phone].

## 2021-02-26 ENCOUNTER — Ambulatory Visit: Payer: Medicare Other | Admitting: Orthopaedic Surgery

## 2021-06-14 ENCOUNTER — Emergency Department: Payer: Medicare Other

## 2021-06-14 ENCOUNTER — Other Ambulatory Visit: Payer: Self-pay

## 2021-06-14 ENCOUNTER — Emergency Department
Admission: EM | Admit: 2021-06-14 | Discharge: 2021-06-14 | Disposition: A | Discharge status: Home / Self Care | Payer: Medicare Other | Attending: Student in an Organized Health Care Education/Training Program | Admitting: Student in an Organized Health Care Education/Training Program

## 2021-06-14 ENCOUNTER — Encounter: Payer: Self-pay | Admitting: Pharmacy Technician

## 2021-06-14 DIAGNOSIS — W01198A Fall on same level from slipping, tripping and stumbling with subsequent striking against other object, initial encounter: Secondary | ICD-10-CM | POA: Diagnosis not present

## 2021-06-14 DIAGNOSIS — S0990XA Unspecified injury of head, initial encounter: Secondary | ICD-10-CM

## 2021-06-14 DIAGNOSIS — S0101XA Laceration without foreign body of scalp, initial encounter: Secondary | ICD-10-CM | POA: Insufficient documentation

## 2021-06-14 DIAGNOSIS — Z23 Encounter for immunization: Secondary | ICD-10-CM | POA: Diagnosis not present

## 2021-06-14 MED ORDER — ACETAMINOPHEN 500 MG PO TABS
1000.0000 mg | ORAL_TABLET | Freq: Once | ORAL | Status: AC
  Administered 2021-06-14: 1000 mg via ORAL
  Filled 2021-06-14: qty 2

## 2021-06-14 MED ORDER — PROBIOTIC 250 MG PO CAPS: 1.0000 | ORAL_CAPSULE | Freq: Two times a day (BID) | ORAL | 0 refills | Status: AC

## 2021-06-14 MED ORDER — LIDOCAINE-EPINEPHRINE 2 %-1:100000 IJ SOLN
20.0000 mL | Freq: Once | INTRAMUSCULAR | Status: AC
  Administered 2021-06-14: 20 mL via INTRADERMAL
  Filled 2021-06-14: qty 1

## 2021-06-14 MED ORDER — CEPHALEXIN 500 MG PO CAPS: 500.0000 mg | ORAL_CAPSULE | Freq: Two times a day (BID) | ORAL | 0 refills | Status: AC

## 2021-06-14 MED ORDER — TETANUS-DIPHTH-ACELL PERTUSSIS 5-2.5-18.5 LF-MCG/0.5 IM SUSY
0.5000 mL | PREFILLED_SYRINGE | Freq: Once | INTRAMUSCULAR | Status: AC
  Administered 2021-06-14: 0.5 mL via INTRAMUSCULAR
  Filled 2021-06-14: qty 0.5

## 2021-06-14 NOTE — ED Triage Notes (Signed)
Pt bib ems from home after falling while getting out of bed and hitting her head on the frame of the bed. Denies LOC, denies blood thinners. Pt alert and oriented, ambulatory without difficulty. Bleeding controlled.

## 2021-06-14 NOTE — ED Provider Notes (Signed)
[facility] Provider Note    Event Date/Time   First MD Initiated Contact with Patient 06/14/21 [phone]     (approximate)   History   Fall   HPI  Donna Frazier is a 76 y.o. female with history of palpitations and migraine headaches not on anticoagulation presents the ER after mechanical fall where she was getting up out of bed and tripped falling hitting her head on the side of the bed.  Denies any LOC.  States that she frequently falls when she is getting up out of bed or sometimes will be prone to sleepwalking.  She denies any chest pain or pressure.  Does have pain to the back of left side of her head.  Denies any numbness or tingling.     Physical Exam   Triage Vital Signs: ED Triage Vitals  Enc Vitals Group     BP 06/14/21 0730 (!) 164/85     Pulse Rate 06/14/21 0728 62     Resp 06/14/21 0730 16     Temp 06/14/21 0730 97.7 F (36.5 C)     Temp Source 06/14/21 0730 Oral     SpO2 06/14/21 0728 98 %     Weight --      Height --      Head Circumference --      Peak Flow --      Pain Score 06/14/21 0729 8     Pain Loc --      Pain Edu? --      Excl. in GC? --     Most recent vital signs: Vitals:   06/14/21 0728 06/14/21 0730  BP:  (!) 164/85  Pulse: 62   Resp:  16  Temp:  97.7 F (36.5 C)  SpO2: 98%      Constitutional: Alert  Eyes: Conjunctivae are normal.  Head: 10 cm full-thickness laceration to left parietal scalp currently hemostatic.  No galeal involvement no foreign bodies. Nose: No congestion/rhinnorhea. Mouth/Throat: Mucous membranes are moist.   Neck: Painless ROM.  Cardiovascular:   Good peripheral circulation. Respiratory: Normal respiratory effort.  No retractions.  Gastrointestinal: Soft and nontender.  Musculoskeletal:  no deformity Neurologic:  MAE spontaneously. No gross focal neurologic deficits are appreciated.  Skin:  Skin is warm, dry and intact. No rash noted. Psychiatric: Mood and affect are normal. Speech  and behavior are normal.    ED Results / Procedures / Treatments   Labs (all labs ordered are listed, but only abnormal results are displayed) Labs Reviewed - No data to display   EKG     RADIOLOGY Please see ED Course for my review and interpretation.  I personally reviewed all radiographic images ordered to evaluate for the above acute complaints and reviewed radiology reports and findings.  These findings were personally discussed with the patient.  Please see medical record for radiology report.    PROCEDURES:  Critical Care performed: No  ..Laceration Repair  Date/Time: 06/14/2021 8:34 AM Performed by: Willy Eddy, MD Authorized by: Willy Eddy, MD   Consent:    Consent obtained:  Verbal   Consent given by:  Patient   Risks discussed:  Infection, pain, retained foreign body, poor cosmetic result and poor wound healing Anesthesia:    Anesthesia method:  Local infiltration   Local anesthetic:  Lidocaine 1% WITH epi Laceration details:    Location:  Scalp   Scalp location:  L parietal   Length (cm):  10   Depth (mm):  5  Pre-procedure details:    Preparation:  Patient was prepped and draped in usual sterile fashion Exploration:    Hemostasis achieved with:  Direct pressure   Contaminated: no   Treatment:    Area cleansed with:  Saline and povidone-iodine   Amount of cleaning:  Extensive   Irrigation solution:  Sterile saline   Irrigation method:  Syringe   Visualized foreign bodies/material removed: no   Skin repair:    Repair method:  Staples   Number of staples:  12 Approximation:    Approximation:  Close Repair type:    Repair type:  Simple Post-procedure details:    Dressing:  Sterile dressing   Procedure completion:  Tolerated well, no immediate complications   MEDICATIONS ORDERED IN ED: Medications  Tdap (BOOSTRIX) injection 0.5 mL (0.5 mLs Intramuscular Given 06/14/21 0759)  lidocaine-EPINEPHrine (XYLOCAINE W/EPI) 2 %-1:100000  (with pres) injection 20 mL (20 mLs Intradermal Given by Other 06/14/21 0801)  acetaminophen (TYLENOL) tablet 1,000 mg (1,000 mg Oral Given 06/14/21 0759)     IMPRESSION / MDM / ASSESSMENT AND PLAN / ED COURSE  I reviewed the triage vital signs and the nursing notes.                              Differential diagnosis includes, but is not limited to, SDH, IPH, concussion, fracture, laceration  Patient presenting after mechanical fall with head injury as described above.  Protecting her airway has laceration as described above tetanus will be updated we will plan lack repair.  CT imaging ordered to evaluate for the blood differential.   Clinical Course as of 06/14/21 0839  Thu Jun 14, 2021  3500 CT head by my review does not show any evidence of IPH subdural.  We will await formal radiology report [PR]  [phone] CT imaging without acute abnormality.  Laceration repaired.  Patient tolerated procedure well.  Will be placed on prophylactic antibiotics given depth of concern for contamination.  Was washed out.  Denies any antibiotic allergies.  Patient stable for outpatient follow-up. [PR]    Clinical Course User Index [PR] Willy Eddy, MD     FINAL CLINICAL IMPRESSION(S) / ED DIAGNOSES   Final diagnoses:  Injury of head, initial encounter  Laceration of scalp, initial encounter     Rx / DC Orders   ED Discharge Orders          Ordered    cephALEXin (KEFLEX) 500 MG capsule  2 times daily        06/14/21 0839    Saccharomyces boulardii (PROBIOTIC) 250 MG CAPS  2 times daily        06/14/21 8299             Note:  This document was prepared using Dragon voice recognition software and may include unintentional dictation errors.    Willy Eddy, MD 06/14/21 [phone]

## 2021-06-14 NOTE — ED Notes (Signed)
Patient Alert and oriented to baseline. Stable and ambulatory to baseline. Patient verbalized understanding of the discharge instructions.  Patient belongings were taken by the patient.   

## 2021-11-15 ENCOUNTER — Other Ambulatory Visit: Payer: Self-pay | Admitting: Internal Medicine

## 2021-11-15 DIAGNOSIS — Z1231 Encounter for screening mammogram for malignant neoplasm of breast: Secondary | ICD-10-CM

## 2022-01-09 ENCOUNTER — Ambulatory Visit: Payer: Medicare Other | Admitting: Podiatry

## 2022-01-09 ENCOUNTER — Encounter: Payer: Self-pay | Admitting: Podiatry

## 2022-01-09 ENCOUNTER — Ambulatory Visit (INDEPENDENT_AMBULATORY_CARE_PROVIDER_SITE_OTHER): Payer: Medicare Other

## 2022-01-09 DIAGNOSIS — M722 Plantar fascial fibromatosis: Secondary | ICD-10-CM

## 2022-01-09 MED ORDER — TRIAMCINOLONE ACETONIDE 40 MG/ML IJ SUSP
20.0000 mg | Freq: Once | INTRAMUSCULAR | Status: AC
  Administered 2022-01-09: 20 mg

## 2022-01-09 MED ORDER — METHYLPREDNISOLONE 4 MG PO TBPK: ORAL_TABLET | ORAL | 0 refills | Status: AC

## 2022-01-09 NOTE — Progress Notes (Signed)
Subjective:  Patient ID: Donna Frazier, female    DOB: [birth year],  MRN: [id number] HPI Chief Complaint  Patient presents with   Foot Pain    Plantar heel left - aching x couple weeks, AM pain, pulling in arch, tried using walker and stretching   New Patient (Initial Visit)    76 y.o. female presents with the above complaint.   ROS: Denies fever chills nausea vomit muscle aches pains calf pain back pain chest pain shortness of breath.  Past Medical History:  Diagnosis Date   DDD (degenerative disc disease), cervical    Hx of peptic ulcer    Hyperlipidemia    Hypothyroidism    Migraine    Osteoarthritis    Palpitations    PSVT (paroxysmal supraventricular tachycardia) (HCC)    a. Previous holter->PAC's and short runs of SVT; b. 05/2018 48hr holter: Avg HR 55 (48-113). <1% pvcs and pacs.   Tobacco abuse    Trigger finger    Past Surgical History:  Procedure Laterality Date   KNEE SURGERY      Current Outpatient Medications:    methylPREDNISolone (MEDROL DOSEPAK) 4 MG TBPK tablet, 6 day dose pack - take as directed, Disp: 21 tablet, Rfl: 0   acetaminophen-codeine (TYLENOL #3) 300-30 MG tablet, Take 1-2 tablets by mouth every 8 (eight) hours as needed., Disp: 30 tablet, Rfl: 0   atorvastatin (LIPITOR) 10 MG tablet, Take 1 tablet (10 mg total) by mouth daily., Disp: 90 tablet, Rfl: 3   bisoprolol (ZEBETA) 5 MG tablet, Take 1 tablet (5 mg total) by mouth 2 (two) times daily., Disp: 60 tablet, Rfl: 11   butalbital-acetaminophen-caffeine (FIORICET) 50-325-40 MG tablet, Take by mouth. Take 1-2 tablets every 8 hours as needed for headache., Disp: , Rfl:    Cyanocobalamin (B-12 PO), Take by mouth daily., Disp: , Rfl:    famotidine (PEPCID) 40 MG tablet, Take 40 mg by mouth 2 (two) times daily., Disp: , Rfl:    levothyroxine (SYNTHROID, LEVOTHROID) 75 MCG tablet, Take one tablet on Mon. Tues. Wed. Thurs & Sun., 1/2 tablet Friday & Sat., Disp: , Rfl:    meclizine (ANTIVERT) 25 MG  tablet, Take 1 tablet (25 mg total) by mouth 3 (three) times daily as needed for dizziness., Disp: 30 tablet, Rfl: 1   Multiple Vitamin (MULTIVITAMIN) capsule, Take 1 capsule by mouth daily., Disp: , Rfl:    Saccharomyces boulardii (PROBIOTIC) 250 MG CAPS, Take 1 capsule by mouth in the morning and at bedtime., Disp: 30 capsule, Rfl: 0  Allergies  Allergen Reactions   Aspirin     Stomach ulcer    Motrin [Ibuprofen]     Stomach ulcer    Review of Systems Objective:  There were no vitals filed for this visit.  General: Well developed, nourished, in no acute distress, alert and oriented x3   Dermatological: Skin is warm, dry and supple bilateral. Nails x 10 are well maintained; remaining integument appears unremarkable at this time. There are no open sores, no preulcerative lesions, no rash or signs of infection present.  Vascular: Dorsalis Pedis artery and Posterior Tibial artery pedal pulses are 2/4 bilateral with immedate capillary fill time. Pedal hair growth present. No varicosities and no lower extremity edema present bilateral.   Neruologic: Grossly intact via light touch bilateral. Vibratory intact via tuning fork bilateral. Protective threshold with Semmes Wienstein monofilament intact to all pedal sites bilateral. Patellar and Achilles deep tendon reflexes 2+ bilateral. No Babinski or clonus noted bilateral.   Musculoskeletal:  No gross boney pedal deformities bilateral. No pain, crepitus, or limitation noted with foot and ankle range of motion bilateral. Muscular strength 5/5 in all groups tested bilateral.  Pain on palpation medial calcaneal tubercle of the left heel.  Gait: Unassisted, Nonantalgic.    Radiographs:  Radiographs taken today demonstrate a osseously mature individual pes planovalgus and a old plantar calcaneal heel spur and a an increase in the soft tissue margins of the plantar fascia at its insertion site.  Assessment & Plan:   Assessment: Pes planovalgus  Planter fasciitis left foot.   Plan: Discussed etiology pathology conservative surgical therapies injected the left heel today 20 mg Kenalog 5 mg Marcaine point and plantar fascia brace but a prescription for methylprednisolone to be followed by meloxicam.  We discussed appropriate shoe gear stretching exercise ice therapy and shoe gear modifications.     Donna Frazier, [place]

## 2022-01-31 ENCOUNTER — Ambulatory Visit
Admission: RE | Admit: 2022-01-31 | Discharge: 2022-01-31 | Disposition: A | Discharge status: Home / Self Care | Payer: Medicare Other | Source: Ambulatory Visit | Attending: Internal Medicine | Admitting: Internal Medicine

## 2022-01-31 DIAGNOSIS — Z1231 Encounter for screening mammogram for malignant neoplasm of breast: Secondary | ICD-10-CM | POA: Insufficient documentation

## 2022-02-11 ENCOUNTER — Ambulatory Visit: Payer: Medicare Other | Admitting: Podiatry

## 2022-02-20 ENCOUNTER — Ambulatory Visit: Payer: Medicare Other | Attending: Cardiovascular Disease | Admitting: Cardiovascular Disease

## 2022-02-20 NOTE — Progress Notes (Deleted)
Cardiology Office Note  Date:  02/20/2022   ID:  Darleth, Eustache 1946-01-23, MRN [id number]  PCP:  Albina Billet, MD   No chief complaint on file.   HPI:  Ms. Donna Frazier is a 76 year-old woman with past medical history of palpitations hypothyroidism  SVT on holter Hx of GI ulcers smokes one pack per day for many years PVC on monitor Who presents for follow-up of her SVT and shortness of breath, dizziness  LOV 9/22    Long history of taking care of her husband for many years Husband died , age 69 Died in nursing home, dementia, legs "stopped working" Not eating  Past three days, dizzy/lightheaded Possible vertigo, Lots of nausea  No regular exercise program Smoker No chest pain or shortness of breath on exertion  Otherwise she has been doing well Tachypalpitations better on bisoprolol twice daily Less palpitations in her neck Tolerating other medications  Prior ZIO monitor showing symptomatic PVCs  Lab work reviewed  Total cholesterol 151 LDL 78 hemoglobin A1c 5.4  EKG personally reviewed by myself on todays visit Shows sinus bradycardia rate 55 bpm no significant ST or T wave changes  Family hx Father with MI Mother with ETOH   PMH:   has a past medical history of DDD (degenerative disc disease), cervical, peptic ulcer, Hyperlipidemia, Hypothyroidism, Migraine, Osteoarthritis, Palpitations, PSVT (paroxysmal supraventricular tachycardia) ([place]), Tobacco abuse, and Trigger finger.  PSH:    Past Surgical History:  Procedure Laterality Date   KNEE SURGERY      Current Outpatient Medications  Medication Sig Dispense Refill   acetaminophen-codeine (TYLENOL #3) 300-30 MG tablet Take 1-2 tablets by mouth every 8 (eight) hours as needed. 30 tablet 0   atorvastatin (LIPITOR) 10 MG tablet Take 1 tablet (10 mg total) by mouth daily. 90 tablet 3   bisoprolol (ZEBETA) 5 MG tablet Take 1 tablet (5 mg total) by mouth 2 (two) times daily. 60 tablet 11    butalbital-acetaminophen-caffeine (FIORICET) 50-325-40 MG tablet Take by mouth. Take 1-2 tablets every 8 hours as needed for headache.     Cyanocobalamin (B-12 PO) Take by mouth daily.     famotidine (PEPCID) 40 MG tablet Take 40 mg by mouth 2 (two) times daily.     levothyroxine (SYNTHROID, LEVOTHROID) 75 MCG tablet Take one tablet on Mon. Tues. Wed. Thurs & Sun., 1/2 tablet Friday & Sat.     meclizine (ANTIVERT) 25 MG tablet Take 1 tablet (25 mg total) by mouth 3 (three) times daily as needed for dizziness. 30 tablet 1   methylPREDNISolone (MEDROL DOSEPAK) 4 MG TBPK tablet 6 day dose pack - take as directed 21 tablet 0   Multiple Vitamin (MULTIVITAMIN) capsule Take 1 capsule by mouth daily.     Saccharomyces boulardii (PROBIOTIC) 250 MG CAPS Take 1 capsule by mouth in the morning and at bedtime. 30 capsule 0   No current facility-administered medications for this visit.    Allergies:   Aspirin and Motrin [ibuprofen]   Social History:  The patient  reports that she has been smoking cigarettes. She has a 45.00 pack-year smoking history. She has never used smokeless tobacco. She reports that she does not currently use alcohol after a past usage of about 1.0 standard drink of alcohol per week. She reports that she does not use drugs.   Family History:   family history includes Heart attack (age of onset: 31) in her father.   Review of Systems  Constitutional: Negative.   HENT: Negative.  Respiratory: Negative.    Cardiovascular:  Positive for palpitations.  Gastrointestinal: Negative.   Musculoskeletal: Negative.   Neurological: Negative.   Psychiatric/Behavioral: Negative.    All other systems reviewed and are negative.   PHYSICAL EXAM: VS:  There were no vitals taken for this visit. , BMI There is no height or weight on file to calculate BMI. Constitutional:  oriented to person, place, and time. No distress.  HENT:  Head: Grossly normal Eyes:  no discharge. No scleral icterus.   Neck: No JVD, no carotid bruits  Cardiovascular: Regular rate and rhythm, no murmurs appreciated Pulmonary/Chest: Clear to auscultation bilaterally, no wheezes or rails Abdominal: Soft.  no distension.  no tenderness.  Musculoskeletal: Normal range of motion Neurological:  normal muscle tone. Coordination normal. No atrophy Skin: Skin warm and dry Psychiatric: normal affect, pleasant   Recent Labs: No results found for requested labs within last 365 days.    Lipid Panel No results found for: "CHOL", "HDL", "LDLCALC", "TRIG"    Wt Readings from Last 3 Encounters:  02/19/21 164 lb (74.4 kg)  02/15/20 157 lb 2 oz (71.3 kg)  07/01/19 159 lb 4 oz (72.2 kg)     ASSESSMENT AND PLAN:  PVCs Prior ZIO monitor was having symptomatic PVCs Better on bisoprolol 5 twice daily  SVT (supraventricular tachycardia) (HCC) - Plan: EKG 12-Lead Better on bisoprolol 5 twice daily No further work-up  Smoking Smoking cessation recommended  Hypothyroidism, unspecified type Manage her primary care  Adjustment disorder Difficulty adjusting after loss of her husband who is 20 years old  Vertigo Prescription provided for meclizine    Total encounter time more than 25 minutes  Greater than 50% was spent in counseling and coordination of care with the patient    No orders of the defined types were placed in this encounter.    Signed, Dossie Arbour, M.D., Ph.D. 02/20/2022  [facility] [place], [place] [phone]

## 2022-03-03 ENCOUNTER — Other Ambulatory Visit: Payer: Self-pay | Admitting: Cardiovascular Disease

## 2022-06-06 ENCOUNTER — Other Ambulatory Visit: Payer: Self-pay | Admitting: Cardiovascular Disease
